# Patient Record
Sex: Male | Born: 1975 | Hispanic: Yes | Marital: Married | Smoking: Never smoker
Health system: Southern US, Community
[De-identification: ages and names within clinical notes are randomized; demographics above are authoritative.]

## PROBLEM LIST (undated history)

## (undated) DIAGNOSIS — G4730 Sleep apnea, unspecified: Secondary | ICD-10-CM

## (undated) DIAGNOSIS — R1030 Lower abdominal pain, unspecified: Secondary | ICD-10-CM

## (undated) DIAGNOSIS — I4891 Unspecified atrial fibrillation: Secondary | ICD-10-CM

## (undated) DIAGNOSIS — R609 Edema, unspecified: Secondary | ICD-10-CM

## (undated) DIAGNOSIS — R079 Chest pain, unspecified: Secondary | ICD-10-CM

## (undated) DIAGNOSIS — I10 Essential (primary) hypertension: Secondary | ICD-10-CM

## (undated) HISTORY — DX: Lower abdominal pain, unspecified: R10.30

## (undated) HISTORY — PX: WRIST SURGERY: SHX841

## (undated) HISTORY — DX: Unspecified atrial fibrillation: I48.91

## (undated) HISTORY — DX: Chest pain, unspecified: R07.9

## (undated) HISTORY — PX: PILONIDAL CYST DRAINAGE: SHX743

---

## 2018-10-02 ENCOUNTER — Emergency Department (HOSPITAL_BASED_OUTPATIENT_CLINIC_OR_DEPARTMENT_OTHER): Payer: Self-pay

## 2018-10-02 ENCOUNTER — Encounter (HOSPITAL_COMMUNITY): Payer: Self-pay | Admitting: Emergency Medicine

## 2018-10-02 ENCOUNTER — Emergency Department (HOSPITAL_COMMUNITY)
Admission: EM | Admit: 2018-10-02 | Discharge: 2018-10-02 | Disposition: A | Discharge status: Home / Self Care | Payer: Self-pay | Attending: Emergency Medicine | Admitting: Emergency Medicine

## 2018-10-02 ENCOUNTER — Other Ambulatory Visit: Payer: Self-pay

## 2018-10-02 DIAGNOSIS — R600 Localized edema: Secondary | ICD-10-CM

## 2018-10-02 DIAGNOSIS — M7989 Other specified soft tissue disorders: Secondary | ICD-10-CM

## 2018-10-02 DIAGNOSIS — R2242 Localized swelling, mass and lump, left lower limb: Secondary | ICD-10-CM | POA: Insufficient documentation

## 2018-10-02 LAB — BASIC METABOLIC PANEL
Anion gap: 9 (ref 5–15)
BUN: 14 mg/dL (ref 6–20)
CO2: 26 mmol/L (ref 22–32)
Calcium: 9.1 mg/dL (ref 8.9–10.3)
Chloride: 105 mmol/L (ref 98–111)
Creatinine, Ser: 1.17 mg/dL (ref 0.61–1.24)
GFR calc Af Amer: >60 mL/min (ref >60)
GFR calc non Af Amer: >60 mL/min (ref >60)
Glucose, Bld: 97 mg/dL (ref 70–99)
Potassium: 4 mmol/L (ref 3.5–5.1)
Sodium: 140 mmol/L (ref 135–145)

## 2018-10-02 LAB — CBC WITH DIFFERENTIAL/PLATELET
Abs Immature Granulocytes: 0.04 10*3/uL (ref 0.00–0.07)
BASOS PCT: 0 %
Basophils Absolute: 0 10*3/uL (ref 0.0–0.1)
Eosinophils Absolute: 0.1 10*3/uL (ref 0.0–0.5)
Eosinophils Relative: 1 %
HCT: 51.9 % (ref 39.0–52.0)
Hemoglobin: 15.8 g/dL (ref 13.0–17.0)
Immature Granulocytes: 0 %
Lymphocytes Relative: 19 %
Lymphs Abs: 1.8 10*3/uL (ref 0.7–4.0)
MCH: 26.2 pg (ref 26.0–34.0)
MCHC: 30.4 g/dL (ref 30.0–36.0)
MCV: 86.2 fL (ref 80.0–100.0)
Monocytes Absolute: 0.9 10*3/uL (ref 0.1–1.0)
Monocytes Relative: 9 %
Neutro Abs: 6.6 10*3/uL (ref 1.7–7.7)
Neutrophils Relative %: 71 %
Platelets: 259 10*3/uL (ref 150–400)
RBC: 6.02 MIL/uL — ABNORMAL HIGH (ref 4.22–5.81)
RDW: 13.5 % (ref 11.5–15.5)
WBC: 9.4 10*3/uL (ref 4.0–10.5)
nRBC: 0 % (ref 0.0–0.2)

## 2018-10-02 NOTE — ED Notes (Signed)
Patient verbalizes understanding of discharge instructions. Opportunity for questioning and answers were provided. Armband removed by staff, pt discharged from ED.  

## 2018-10-02 NOTE — Discharge Instructions (Addendum)
Return for any problem.  Follow-up with your regular care provider as instructed. °

## 2018-10-02 NOTE — ED Triage Notes (Signed)
Pt arrives to ED from with complaints of bi lateral feet swelling that started three days ago. Pt reports he was sent here from [facility] for DVT rule out.

## 2018-10-02 NOTE — ED Notes (Signed)
Leg swelling, no sob

## 2018-10-02 NOTE — ED Provider Notes (Addendum)
[facility] Provider Note   CSN: [id number] Arrival date & time: 10/02/18  1146     History   Chief Complaint Chief Complaint  Patient presents with  . Leg Swelling    HPI Bruce Marshall is a 43 y.o. male.  43 year old male with prior medical history as detailed below presents for evaluation of lower extremity edema.  He reports gradually worsening lower extremity edema to both legs over the last 2 to 3 weeks.  Patient was seen by urgent care provider earlier today.  He was sent to the ED to rule out DVT.  He denies prior history of DVT.  He denies any specific injury.  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  He does not have a particularly active job.  The history is provided by the patient and medical records.  Illness  Location:  Bilateral lower extremity edema Severity:  Mild Onset quality:  Gradual Duration:  3 weeks Timing:  Constant Progression:  Waxing and waning Chronicity:  New Associated symptoms: no chest pain and no fever     History reviewed. No pertinent past medical history.  There are no active problems to display for this patient.   History reviewed. No pertinent surgical history.      Home Medications    Prior to Admission medications   Not on File    Family History History reviewed. No pertinent family history.  Social History Social History   Tobacco Use  . Smoking status: Never Smoker  . Smokeless tobacco: Never Used  Substance Use Topics  . Alcohol use: Not on file  . Drug use: Not on file     Allergies   Patient has no allergy information on record.   Review of Systems Review of Systems  Constitutional: Negative for fever.  Cardiovascular: Negative for chest pain.  All other systems reviewed and are negative.    Physical Exam Updated Vital Signs BP (!) 147/94 (BP Location: Right Arm)   Pulse 97   Temp 98.2 F (36.8 C) (Oral)   Resp 16   Ht 5\' 6"  (1.676 m)   Wt  (!) 137.4 kg   SpO2 97%   BMI 48.91 kg/m   Physical Exam Vitals signs and nursing note reviewed.  Constitutional:      General: He is not in acute distress.    Appearance: He is well-developed.  HENT:     Head: Normocephalic and atraumatic.  Eyes:     Conjunctiva/sclera: Conjunctivae normal.     Pupils: Pupils are equal, round, and reactive to light.  Neck:     Musculoskeletal: Normal range of motion and neck supple.  Cardiovascular:     Rate and Rhythm: Normal rate and regular rhythm.     Heart sounds: Normal heart sounds.  Pulmonary:     Effort: Pulmonary effort is normal. No respiratory distress.     Breath sounds: Normal breath sounds.  Abdominal:     General: There is no distension.     Palpations: Abdomen is soft.     Tenderness: There is no abdominal tenderness.  Musculoskeletal: Normal range of motion.        General: No deformity.     Right lower leg: Edema present.     Left lower leg: Edema present.     Comments: Mild bilateral 1+ plus edema to BLE  Skin:    General: Skin is warm and dry.  Neurological:     Mental Status: He is alert and  oriented to person, place, and time.      ED Treatments / Results  Labs (all labs ordered are listed, but only abnormal results are displayed) Labs Reviewed  CBC WITH DIFFERENTIAL/PLATELET - Abnormal; Notable for the following components:      Result Value   RBC 6.02 (*)    All other components within normal limits  BASIC METABOLIC PANEL    EKG None  Radiology Vas [place] Lower Extremity Venous (dvt) (only Mc & Wl 7a-7p)  Result Date: 10/02/2018  Lower Venous Study Indications: Swelling.  Performing Technologist: Gertie Fey MHA, RDMS, RVT, RDCS  Examination Guidelines: A complete evaluation includes B-mode imaging, spectral Doppler, color Doppler, and power Doppler as needed of all accessible portions of each vessel. Bilateral testing is considered an integral part of a complete examination. Limited examinations  for reoccurring indications may be performed as noted.  Right Venous Findings: +---------+---------------+---------+-----------+----------+-------+          CompressibilityPhasicitySpontaneityPropertiesSummary +---------+---------------+---------+-----------+----------+-------+ CFV      Full           Yes      Yes                          +---------+---------------+---------+-----------+----------+-------+ SFJ      Full                                                 +---------+---------------+---------+-----------+----------+-------+ FV Prox  Full                                                 +---------+---------------+---------+-----------+----------+-------+ FV Mid   Full                                                 +---------+---------------+---------+-----------+----------+-------+ FV DistalFull                                                 +---------+---------------+---------+-----------+----------+-------+ PFV      Full                                                 +---------+---------------+---------+-----------+----------+-------+ POP      Full           Yes      Yes                          +---------+---------------+---------+-----------+----------+-------+ PTV      Full                                                 +---------+---------------+---------+-----------+----------+-------+ PERO     Full                                                 +---------+---------------+---------+-----------+----------+-------+  Left Venous Findings: +---------+---------------+---------+-----------+----------+-------+          CompressibilityPhasicitySpontaneityPropertiesSummary +---------+---------------+---------+-----------+----------+-------+ CFV      Full           Yes      Yes                          +---------+---------------+---------+-----------+----------+-------+ SFJ      Full                                                  +---------+---------------+---------+-----------+----------+-------+ FV Prox  Full                                                 +---------+---------------+---------+-----------+----------+-------+ FV Mid   Full                                                 +---------+---------------+---------+-----------+----------+-------+ FV DistalFull                                                 +---------+---------------+---------+-----------+----------+-------+ PFV      Full                                                 +---------+---------------+---------+-----------+----------+-------+ POP      Full           Yes      Yes                          +---------+---------------+---------+-----------+----------+-------+ PTV      Full                                                 +---------+---------------+---------+-----------+----------+-------+ PERO     Full                                                 +---------+---------------+---------+-----------+----------+-------+    Summary: Right: There is no evidence of deep vein thrombosis in the lower extremity. No cystic structure found in the popliteal fossa. Left: There is no evidence of deep vein thrombosis in the lower extremity. No cystic structure found in the popliteal fossa.  *See table(s) above for measurements and observations.    Preliminary     Procedures Procedures (including critical care time)  Medications Ordered in ED Medications - No data to display   Initial Impression / Assessment and Plan / ED Course  I have reviewed the triage vital signs and the nursing notes.  Pertinent labs & imaging results that were available during my care of the patient were reviewed by me and considered in my medical decision making (see chart for details).     MDM  Screen complete  43 year old male presenting for evaluation of lower extremity edema.  This is most likely secondary to  patient's body habitus and low level of activity.  Screening labs obtained in the ED are without significant abnormality.  DVT study is negative.    Patient does understand the need for close outpatient follow-up.  Strict return precautions given and understood.  Final Clinical Impressions(s) / ED Diagnoses   Final diagnoses:  Leg edema    ED Discharge Orders    None       Wynetta FinesMessick, Peter C, MD 10/02/18 1440    Wynetta FinesMessick, Peter C, MD 11/12/18 [phone]

## 2018-10-02 NOTE — Progress Notes (Signed)
Bilateral lower extremity venous duplex completed. Refer to "CV Proc" under chart review to view preliminary results.  10/02/2018 1:19 PM Gertie Fey, MHA, RVT, RDCS, RDMS

## 2018-10-26 ENCOUNTER — Ambulatory Visit (INDEPENDENT_AMBULATORY_CARE_PROVIDER_SITE_OTHER): Payer: Self-pay | Admitting: Family Medicine

## 2018-10-26 ENCOUNTER — Ambulatory Visit: Payer: Self-pay

## 2018-10-26 ENCOUNTER — Encounter: Payer: Self-pay | Admitting: Family Medicine

## 2018-10-26 VITALS — BP 132/83 | HR 80 | Resp 17 | Ht 67.0 in | Wt 300.6 lb

## 2018-10-26 DIAGNOSIS — Z1329 Encounter for screening for other suspected endocrine disorder: Secondary | ICD-10-CM

## 2018-10-26 DIAGNOSIS — Z7689 Persons encountering health services in other specified circumstances: Secondary | ICD-10-CM

## 2018-10-26 DIAGNOSIS — R600 Localized edema: Secondary | ICD-10-CM

## 2018-10-26 DIAGNOSIS — R2242 Localized swelling, mass and lump, left lower limb: Secondary | ICD-10-CM

## 2018-10-26 DIAGNOSIS — Z131 Encounter for screening for diabetes mellitus: Secondary | ICD-10-CM

## 2018-10-26 DIAGNOSIS — R2241 Localized swelling, mass and lump, right lower limb: Secondary | ICD-10-CM

## 2018-10-26 MED ORDER — FUROSEMIDE 20 MG PO TABS: 20.0000 mg | ORAL_TABLET | Freq: Every day | ORAL | 3 refills | Status: DC | PRN

## 2018-10-26 MED ORDER — POTASSIUM CHLORIDE ER 10 MEQ PO TBCR: 10.0000 meq | EXTENDED_RELEASE_TABLET | Freq: Every day | ORAL | 0 refills | Status: DC | PRN

## 2018-10-26 NOTE — Progress Notes (Deleted)
New Patient Office Visit  Subjective:  Patient ID: Bruce Marshall, male    DOB: 1975-11-16  Age: 43 y.o. MRN: [id number]  CC:  Chief Complaint  Patient presents with  . Establish Care  . Follow-up    ED 1/17: B lower extremity swelling. still having the L leg swelling. has been wearing compression socks. is also getting out of the car more frequently    HPI Bruce Marshall is a 43 year old Hispanic male that presents today for Emergency Department follow up on bilateral lower extremity swelling. He endorses that he has been wearing the compression socks, however he hasn't decreased fluid intake as advised post ED visit. He endorses that he wasn't experiencing any symptoms and denies any injury leading up to the swelling in his lower extremities. He denies any injury or pain in the area. He endorses that he doesn't exercise and has a sedentary lifestyle.He endorses that he is overall generally healthy. He denies any chest pain, palpitations or shortness of breath.  No past medical history on file.  No past surgical history on file.  Family History  Problem Relation Age of Onset  . Healthy Daughter   . Heart attack Maternal Uncle   . Cancer Neg Hx   . Stroke Neg Hx   Hypertension- Mother  Social History   Socioeconomic History  . Marital status: Married    Spouse name: Not on file  . Number of children: Not on file  . Years of education: Not on file  . Highest education level: Not on file  Occupational History  . Not on file  Social Needs  . Financial resource strain: Not on file  . Food insecurity:    Worry: Not on file    Inability: Not on file  . Transportation needs:    Medical: Not on file    Non-medical: Not on file  Tobacco Use  . Smoking status: Never Smoker  . Smokeless tobacco: Never Used  Substance and Sexual Activity  . Alcohol use: Not on file  . Drug use: Not on file  . Sexual activity: Not on file  Lifestyle  . Physical activity:    Days per week:  Not on file    Minutes per session: Not on file  . Stress: Not on file  Relationships  . Social connections:    Talks on phone: Not on file    Gets together: Not on file    Attends religious service: Not on file    Active member of club or organization: Not on file    Attends meetings of clubs or organizations: Not on file    Relationship status: Not on file  . Intimate partner violence:    Fear of current or ex partner: Not on file    Emotionally abused: Not on file    Physically abused: Not on file    Forced sexual activity: Not on file  Other Topics Concern  . Not on file  Social History Narrative  . Not on file    ROS Review of Systems  Constitutional: Negative for activity change, appetite change, fatigue, fever and unexpected weight change.  Respiratory: Negative for cough, chest tightness, shortness of breath and wheezing.   Cardiovascular: Positive for leg swelling. Negative for chest pain and palpitations.  Endocrine: Positive for polydipsia and polyuria.  Neurological: Negative for dizziness, light-headedness, numbness and headaches.    Objective:   Today's Vitals: BP 132/83   Pulse 80   Resp 17  Ht 5\' 7"  (1.702 m)   Wt (!) 300 lb 9.6 oz (136.4 kg)   SpO2 97%   BMI 47.08 kg/m   Physical Exam Constitutional:      Appearance: Normal appearance. He is normal weight.  Cardiovascular:     Rate and Rhythm: Normal rate and regular rhythm.     Pulses: Normal pulses.     Heart sounds: Normal heart sounds.  Pulmonary:     Effort: Pulmonary effort is normal.     Breath sounds: Normal breath sounds.  Musculoskeletal:     Right lower leg: Edema present.     Left lower leg: Edema present.     Comments: 1+ pitting edema in the left pedal and lower left leg and some trace edema in the right foot and lower leg  Skin:    General: Skin is warm and dry.     Findings: Erythema present.     Comments: Redness in lower bilateral legs  Neurological:     Mental Status:  He is alert and oriented to person, place, and time. Mental status is at baseline.  Psychiatric:        Mood and Affect: Mood normal.        Behavior: Behavior normal.        Thought Content: Thought content normal.        Judgment: Judgment normal.     Assessment & Plan:   Problem List Items Addressed This Visit    None    Visit Diagnoses    Encounter to establish care    -  Primary   Bilateral edema of lower extremity       Relevant Orders   EKG 12-Lead (Completed)   DG Chest 2 View (Completed)   Sedimentation Rate   Screening for thyroid disorder       Relevant Orders   Thyroid Panel With TSH   Screening for diabetes mellitus       Relevant Orders   Hemoglobin A1c      Outpatient Encounter Medications as of 10/26/2018  Medication Sig  . furosemide (LASIX) 20 MG tablet Take 1 tablet (20 mg total) by mouth daily as needed for fluid or edema (Take one dose of potassium with Furosemide dose).  . potassium chloride (K-DUR) 10 MEQ tablet Take 1 tablet (10 mEq total) by mouth daily as needed (Take one dose with each dose of Lasix as need).   No facility-administered encounter medications on file as of 10/26/2018.     Lasix and potassium as needed for lower extremity swelling, and continued use of compression socks.  A total of 20 minutes spent, greater than 50 % of this time was spent assessing, counseling and coordination of care.  Follow-up: Return in about 4 weeks (around 11/23/2018) for return for swelling follow up and lasix use.    Legrand Pitts, FNP-S

## 2018-10-26 NOTE — Progress Notes (Deleted)
Bruce Marshall, is a 43 y.o. male  POL:[id number]  HOO:[id number]  DOB - 1976/02/25  CC:  Chief Complaint  Patient presents with  . Establish Care  . Follow-up    ED 1/17: B lower extremity swelling. still having the L leg swelling. has been wearing compression socks. is also getting out of the car more frequently       HPI: Bruce Marshall is a 43 y.o. male is here today to establish care.   Bruce Marshall does not have a problem list on file.    Today's visit:    Patient denies new headaches, chest pain, abdominal pain, nausea, new weakness , numbness or tingling, SOB, edema, or worrisome cough. .    Current medications:No current outpatient medications on file.   Pertinent family medical history: family history includes Healthy in his daughter; Heart attack in his maternal uncle.   No Known Allergies  Social History   Socioeconomic History  . Marital status: Married    Spouse name: Not on file  . Number of children: Not on file  . Years of education: Not on file  . Highest education level: Not on file  Occupational History  . Not on file  Social Needs  . Financial resource strain: Not on file  . Food insecurity:    Worry: Not on file    Inability: Not on file  . Transportation needs:    Medical: Not on file    Non-medical: Not on file  Tobacco Use  . Smoking status: Never Smoker  . Smokeless tobacco: Never Used  Substance and Sexual Activity  . Alcohol use: Not on file  . Drug use: Not on file  . Sexual activity: Not on file  Lifestyle  . Physical activity:    Days per week: Not on file    Minutes per session: Not on file  . Stress: Not on file  Relationships  . Social connections:    Talks on phone: Not on file    Gets together: Not on file    Attends religious service: Not on file    Active member of club or organization: Not on file    Attends meetings of clubs or organizations: Not on file    Relationship status: Not on file  . Intimate partner  violence:    Fear of current or ex partner: Not on file    Emotionally abused: Not on file    Physically abused: Not on file    Forced sexual activity: Not on file  Other Topics Concern  . Not on file  Social History Narrative  . Not on file    Review of Systems: Constitutional: Negative for fever, chills, diaphoresis, activity change, appetite change and fatigue. HENT: Negative for ear pain, nosebleeds, congestion, facial swelling, rhinorrhea, neck pain, neck stiffness and ear discharge.  Eyes: Negative for pain, discharge, redness, itching and visual disturbance. Respiratory: Negative for cough, choking, chest tightness, shortness of breath, wheezing and stridor.  Cardiovascular: Negative for chest pain, palpitations and leg swelling. Gastrointestinal: Negative for abdominal distention. Genitourinary: Negative for dysuria, urgency, frequency, hematuria, flank pain, decreased urine volume, difficulty urinating. Musculoskeletal: Negative for back pain, joint swelling, arthralgia and gait problem. Neurological: Negative for dizziness, tremors, seizures, syncope, facial asymmetry, speech difficulty, weakness, light-headedness, numbness and headaches.  Hematological: Negative for adenopathy. Does not bruise/bleed easily. Psychiatric/Behavioral: Negative for hallucinations, behavioral problems, confusion, dysphoric mood, decreased concentration and agitation.    Objective:   Vitals:   10/26/18 1047  BP: 132/83  Pulse: 80  Resp: 17  SpO2: 97%    BP Readings from Last 3 Encounters:  10/26/18 132/83  10/02/18 (!) 147/94    Filed Weights   10/26/18 1047  Weight: (!) 300 lb 9.6 oz (136.4 kg)      Physical Exam: Constitutional: Patient appears well-developed and well-nourished. No distress. HENT: Normocephalic, atraumatic, External right and left ear normal. Oropharynx is clear and moist.  Eyes: Conjunctivae and EOM are normal. PERRLA, no scleral icterus. Neck: Normal ROM. Neck  supple. No JVD. No tracheal deviation. No thyromegaly. CVS: RRR, S1/S2 +, no murmurs, no gallops, no carotid bruit.  Pulmonary: Effort and breath sounds normal, no stridor, rhonchi, wheezes, rales.  Abdominal: Soft. BS +, no distension, tenderness, rebound or guarding.  Musculoskeletal: Normal range of motion. No edema and no tenderness.  Neuro: Alert. Normal muscle tone coordination. Normal gait. BUE and BLE strength 5/5. Bilateral hand grips symmetrical. No cranial nerve deficit. Skin: Skin is warm and dry. No rash noted. Not diaphoretic. No erythema. No pallor. Psychiatric: Normal mood and affect. Behavior, judgment, thought content normal.  Lab Results (prior encounters)  Lab Results  Component Value Date   WBC 9.4 10/02/2018   HGB 15.8 10/02/2018   HCT 51.9 10/02/2018   MCV 86.2 10/02/2018   PLT 259 10/02/2018   Lab Results  Component Value Date   CREATININE 1.17 10/02/2018   BUN 14 10/02/2018   NA 140 10/02/2018   K 4.0 10/02/2018   CL 105 10/02/2018   CO2 26 10/02/2018    No results found for: HGBA1C  No results found for: CHOL, TRIG, HDL, CHOLHDL, VLDL, LDLCALC      Assessment and plan:  1. Encounter to establish care ***   No follow-ups on file.   The patient was given clear instructions to go to ER or return to medical center if symptoms don't improve, worsen or new problems develop. The patient verbalized understanding. The patient was advised  to call and obtain lab results if they haven't heard anything from out office within 7-10 business days.  Bruce CourtsKimberly Maryjayne Kleven, FNP [facility] [address], [place] [id number] [phone]: [phone]    This note has been created with Dragon speech recognition software and Paediatric nursesmart phrase technology. Any transcriptional errors are unintentional.

## 2018-10-26 NOTE — Patient Instructions (Addendum)
Thank you for choosing Primary Care at [facility] to be your medical home!    Bruce Marshall was seen by Joaquin Courts, FNP today.   Marquita Palms Laden's primary care provider is Bing Neighbors, FNP.   For the best care possible, you should try to see Joaquin Courts, FNP-C whenever you come to the clinic.   We look forward to seeing you again soon!  If you have any questions about your visit today, please call us at [phone] or feel free to reach your primary care provider via MyChart.    I am prescribing lasix and potassium as needed. Please only take when you have swelling. You must take the lasix and potassium together.  I will have you follow up in 4 weeks for return for monitoring of lower extremity swelling and lasix use  Please continue to wear your compression socks daily Edema  Edema is when you have too much fluid in your body or under your skin. Edema may make your legs, feet, and ankles swell up. Swelling is also common in looser tissues, like around your eyes. This is a common condition. It gets more common as you get older. There are many possible causes of edema. Eating too much salt (sodium) and being on your feet or sitting for a long time can cause edema in your legs, feet, and ankles. Hot weather may make edema worse. Edema is usually painless. Your skin may look swollen or shiny. Follow these instructions at home:  Keep the swollen body part raised (elevated) above the level of your heart when you are sitting or lying down.  Do not sit still or stand for a long time.  Do not wear tight clothes. Do not wear garters on your upper legs.  Exercise your legs. This can help the swelling go down.  Wear elastic bandages or support stockings as told by your doctor.  Eat a low-salt (low-sodium) diet to reduce fluid as told by your doctor.  Depending on the cause of your swelling, you may need to limit how much fluid you drink (fluid restriction).  Take  over-the-counter and prescription medicines only as told by your doctor. Contact a doctor if:  Treatment is not working.  You have heart, liver, or kidney disease and have symptoms of edema.  You have sudden and unexplained weight gain. Get help right away if:  You have shortness of breath or chest pain.  You cannot breathe when you lie down.  You have pain, redness, or warmth in the swollen areas.  You have heart, liver, or kidney disease and get edema all of a sudden.  You have a fever and your symptoms get worse all of a sudden. Summary  Edema is when you have too much fluid in your body or under your skin.  Edema may make your legs, feet, and ankles swell up. Swelling is also common in looser tissues, like around your eyes.  Raise (elevate) the swollen body part above the level of your heart when you are sitting or lying down.  Follow your doctor's instructions about diet and how much fluid you can drink (fluid restriction). This information is not intended to replace advice given to you by your health care provider. Make sure you discuss any questions you have with your health care provider. Document Released: 02/19/2008 Document Revised: 09/20/2016 Document Reviewed: 09/20/2016 Elsevier Interactive Patient Education  2019 Elsevier Inc.  Preventing Hypertension Hypertension, commonly called high blood pressure, is when the force of blood  pumping through the arteries is too strong. Arteries are blood vessels that carry blood from the heart throughout the body. Over time, hypertension can damage the arteries and decrease blood flow to important parts of the body, including the brain, heart, and kidneys. Often, hypertension does not cause symptoms until blood pressure is very high. For this reason, it is important to have your blood pressure checked on a regular basis. Hypertension can often be prevented with diet and lifestyle changes. If you already have hypertension, you can  control it with diet and lifestyle changes, as well as medicine. What nutrition changes can be made? Maintain a healthy diet. This includes:  Eating less salt (sodium). Ask your health care provider how much sodium is safe for you to have. The general recommendation is to consume less than 1 tsp (2,300 mg) of sodium a day. ? Do not add salt to your food. ? Choose low-sodium options when grocery shopping and eating out.  Limiting fats in your diet. You can do this by eating low-fat or fat-free dairy products and by eating less red meat.  Eating more fruits, vegetables, and whole grains. Make a goal to eat: ? 1-2 cups of fresh fruits and vegetables each day. ? 3-4 servings of whole grains each day.  Avoiding foods and beverages that have added sugars.  Eating fish that contain healthy fats (omega-3 fatty acids), such as mackerel or salmon. If you need help putting together a healthy eating plan, try the DASH diet. This diet is high in fruits, vegetables, and whole grains. It is low in sodium, red meat, and added sugars. DASH stands for Dietary Approaches to Stop Hypertension. What lifestyle changes can be made?   Lose weight if you are overweight. Losing just 3?5% of your body weight can help prevent or control hypertension. ? For example, if your present weight is 200 lb (91 kg), a loss of 3-5% of your weight means losing 6-10 lb (2.7-4.5 kg). ? Ask your health care provider to help you with a diet and exercise plan to safely lose weight.  Get enough exercise. Do at least 150 minutes of moderate-intensity exercise each week. ? You could do this in short exercise sessions several times a day, or you could do longer exercise sessions a few times a week. For example, you could take a brisk 10-minute walk or bike ride, 3 times a day, for 5 days a week.  Find ways to reduce stress, such as exercising, meditating, listening to music, or taking a yoga class. If you need help reducing stress, ask  your health care provider.  Do not smoke. This includes e-cigarettes. Chemicals in tobacco and nicotine products raise your blood pressure each time you smoke. If you need help quitting, ask your health care provider.  Avoid alcohol. If you drink alcohol, limit alcohol intake to no more than 1 drink a day for nonpregnant women and 2 drinks a day for men. One drink equals 12 oz of beer, 5 oz of wine, or 1 oz of hard liquor. Why are these changes important? Diet and lifestyle changes can help you prevent hypertension, and they may make you feel better overall and improve your quality of life. If you have hypertension, making these changes will help you control it and help prevent major complications, such as:  Hardening and narrowing of arteries that supply blood to: ? Your heart. This can cause a heart attack. ? Your brain. This can cause a stroke. ? Your kidneys. This  can cause kidney failure.  Stress on your heart muscle, which can cause heart failure. What can I do to lower my risk?  Work with your health care provider to make a hypertension prevention plan that works for you. Follow your plan and keep all follow-up visits as told by your health care provider.  Learn how to check your blood pressure at home. Make sure that you know your personal target blood pressure, as told by your health care provider. How is this treated? In addition to diet and lifestyle changes, your health care provider may recommend medicines to help lower your blood pressure. You may need to try a few different medicines to find what works best for you. You also may need to take more than one medicine. Take over-the-counter and prescription medicines only as told by your health care provider. Where to find support Your health care provider can help you prevent hypertension and help you keep your blood pressure at a healthy level. Your local hospital or your community may also provide support services and prevention  programs. The American Heart Association offers an online support network at: https://www.lee.net/http://supportnetwork.heart.org/high-blood-pressure Where to find more information Learn more about hypertension from:  National Heart, Lung, and Blood Institute: https://www.peterson.org/www.nhlbi.nih.gov/health/health-topics/topics/hbp  Centers for Disease Control and Prevention: AboutHD.co.nzwww.cdc.gov/bloodpressure  American Academy of Family Physicians: http://familydoctor.org/familydoctor/en/diseases-conditions/high-blood-pressure.printerview.all.html Learn more about the DASH diet from:  National Heart, Lung, and Blood Institute: WedMap.itwww.nhlbi.nih.gov/health/health-topics/topics/dash Contact a health care provider if:  You think you are having a reaction to medicines you have taken.  You have recurrent headaches or feel dizzy.  You have swelling in your ankles.  You have trouble with your vision. Summary  Hypertension often does not cause any symptoms until blood pressure is very high. It is important to get your blood pressure checked regularly.  Diet and lifestyle changes are the most important steps in preventing hypertension.  By keeping your blood pressure in a healthy range, you can prevent complications like heart attack, heart failure, stroke, and kidney failure.  Work with your health care provider to make a hypertension prevention plan that works for you. This information is not intended to replace advice given to you by your health care provider. Make sure you discuss any questions you have with your health care provider. Document Released: 09/17/2015 Document Revised: 05/13/2016 Document Reviewed: 05/13/2016 Elsevier Interactive Patient Education  2019 [employer]

## 2018-10-27 ENCOUNTER — Telehealth: Payer: Self-pay | Admitting: Family Medicine

## 2018-10-27 LAB — HEMOGLOBIN A1C
Est. average glucose Bld gHb Est-mCnc: 126 mg/dL
Hgb A1c MFr Bld: 6 % — ABNORMAL HIGH (ref 4.8–5.6)

## 2018-10-27 LAB — THYROID PANEL WITH TSH
Free Thyroxine Index: 1.7 (ref 1.2–4.9)
T3 Uptake Ratio: 26 % (ref 24–39)
T4, Total: 6.5 ug/dL (ref 4.5–12.0)
TSH: 2.52 u[IU]/mL (ref 0.450–4.500)

## 2018-10-27 LAB — SEDIMENTATION RATE: SED RATE: 39 mm/h — AB (ref 0–15)

## 2018-10-27 NOTE — Telephone Encounter (Signed)
Notify  patient of normal chest x-ray. No changes to plan discussed during office visit yesterday.

## 2018-10-27 NOTE — Telephone Encounter (Signed)
Pt returned call and informed of chest x ray results.

## 2018-11-01 NOTE — Progress Notes (Signed)
Bruce Marshall, is a 43 y.o. male  DJS:[id number]  YIF:[id number]  DOB - 02/02/1976  CC:  Chief Complaint  Patient presents with  . Establish Care  . Follow-up    ED 1/17: B lower extremity swelling. still having the L leg swelling. has been wearing compression socks. is also getting out of the car more frequently       HPI: Bruce Marshall is a 43 y.o. male is here today to establish care.   Bruce Marshall does not have a problem list on file.    Today's visit:  Ana Klebe is here for ED follow-up. He presented to the ER 10/02/18 with a complaint of bilateral lower leg swelling. Work-up was negative for DVT. Swelling had been present at that time for 2- weeks. ER discharged provider recommended compression wear and ambulation which has improved symptoms. He continues to have mild residual left leg swelling. Reports compliance with compression wear. He sits > 10 hours per day with work. No chest pain, shortness of breath, history of cardiovascular disease, or prior lower extremity injury. No recent increase consumption of sodium. Current Body mass index is 47.08 kg/m.   Current medications: Current Outpatient Medications:  .  furosemide (LASIX) 20 MG tablet, Take 1 tablet (20 mg total) by mouth daily as needed for fluid or edema (Take one dose of potassium with Furosemide dose)., Disp: 30 tablet, Rfl: 3 .  potassium chloride (K-DUR) 10 MEQ tablet, Take 1 tablet (10 mEq total) by mouth daily as needed (Take one dose with each dose of Lasix as need)., Disp: 30 tablet, Rfl: 0   Pertinent family medical history: family history includes Healthy in his daughter; Heart attack in his maternal uncle.   No Known Allergies  Social History   Socioeconomic History  . Marital status: Married    Spouse name: Not on file  . Number of children: Not on file  . Years of education: Not on file  . Highest education level: Not on file  Occupational History  . Not on file  Social Needs  . Financial  resource strain: Not on file  . Food insecurity:    Worry: Not on file    Inability: Not on file  . Transportation needs:    Medical: Not on file    Non-medical: Not on file  Tobacco Use  . Smoking status: Never Smoker  . Smokeless tobacco: Never Used  Substance and Sexual Activity  . Alcohol use: Not on file  . Drug use: Not on file  . Sexual activity: Not on file  Lifestyle  . Physical activity:    Days per week: Not on file    Minutes per session: Not on file  . Stress: Not on file  Relationships  . Social connections:    Talks on phone: Not on file    Gets together: Not on file    Attends religious service: Not on file    Active member of club or organization: Not on file    Attends meetings of clubs or organizations: Not on file    Relationship status: Not on file  . Intimate partner violence:    Fear of current or ex partner: Not on file    Emotionally abused: Not on file    Physically abused: Not on file    Forced sexual activity: Not on file  Other Topics Concern  . Not on file  Social History Narrative  . Not on file    Review of Systems: Pertinent negatives  listed in HPI  Objective:   Vitals:   10/26/18 1047  BP: 132/83  Pulse: 80  Resp: 17  SpO2: 97%    BP Readings from Last 3 Encounters:  10/26/18 132/83  10/02/18 (!) 147/94    Filed Weights   10/26/18 1047  Weight: (!) 300 lb 9.6 oz (136.4 kg)      Physical Exam: Constitutional: Patient appears well-developed and well-nourished. No distress. HENT: Normocephalic, atraumatic, External right and left ear normal. Eyes: Conjunctivae and EOM are normal. PERRLA, no scleral icterus. Neck: Normal ROM. Neck supple. No JVD. No tracheal deviation. No thyromegaly. CVS: RRR, S1/S2 +, no murmurs, no gallops, no carotid bruit.  Pulmonary: Effort and breath sounds normal, no stridor, rhonchi, wheezes, rales.  Abdominal: Soft. BS +, no distension, tenderness, rebound or guarding.  Musculoskeletal:  Normal range of motion. Trace bilateral LE edema and no tenderness.  Neuro: Alert. Normal muscle tone coordination. Normal gait. BUE and BLE strength 5/5. Bilateral hand grips symmetrical. Skin: Skin is warm and dry. No rash noted. Not diaphoretic. No erythema. No pallor. Psychiatric: Normal mood and affect. Behavior, judgment, thought content normal.  Lab Results (prior encounters)  Lab Results  Component Value Date   WBC 9.4 10/02/2018   HGB 15.8 10/02/2018   HCT 51.9 10/02/2018   MCV 86.2 10/02/2018   PLT 259 10/02/2018   Lab Results  Component Value Date   CREATININE 1.17 10/02/2018   BUN 14 10/02/2018   NA 140 10/02/2018   K 4.0 10/02/2018   CL 105 10/02/2018   CO2 26 10/02/2018    Lab Results  Component Value Date   HGBA1C 6.0 (H) 10/26/2018    No results found for: CHOL, TRIG, HDL, CHOLHDL, VLDL, LDLCALC      Assessment and plan:  1. Encounter to establish care 2. Bilateral edema of lower extremity Will trial PRN furosemide 20 mg with potassium replacement  To reduce leg swelling.  Patient is to follow-up in 4-6 weeks for evaluation of leg swelling   - EKG 12-Lead, negative LVH or any ST changes. Rhythm CR - DG Chest 2 View; negative of any acute findings  - Sedimentation Rate  3. Screening for thyroid disorder - Thyroid Panel With TSH  4. Screening for diabetes mellitus - Hemoglobin A1c   Return in about 4 weeks (around 11/23/2018) for return for swelling follow up and lasix use.   The patient was given clear instructions to go to ER or return to medical center if symptoms don't improve, worsen or new problems develop. The patient verbalized understanding. The patient was advised  to call and obtain lab results if they haven't heard anything from out office within 7-10 business days.  Joaquin Courts, FNP-C Primary Care at [facility] [address] [place] [address] [phone]: [phone]    This note has been created with  Dragon speech recognition software and Paediatric nurse. Any transcriptional errors are unintentional.

## 2018-11-06 ENCOUNTER — Telehealth: Payer: Self-pay | Admitting: Family Medicine

## 2018-11-06 NOTE — Progress Notes (Signed)
Patient notified of results & recommendations. Expressed understanding.

## 2018-11-23 ENCOUNTER — Ambulatory Visit: Payer: Self-pay | Admitting: Family Medicine

## 2018-11-25 ENCOUNTER — Ambulatory Visit (INDEPENDENT_AMBULATORY_CARE_PROVIDER_SITE_OTHER): Payer: Self-pay | Admitting: Family Medicine

## 2018-11-25 ENCOUNTER — Other Ambulatory Visit: Payer: Self-pay

## 2018-11-25 ENCOUNTER — Encounter: Payer: Self-pay | Admitting: Family Medicine

## 2018-11-25 VITALS — BP 112/75 | HR 75 | Temp 98.4°F | Resp 17 | Ht 67.0 in | Wt 293.4 lb

## 2018-11-25 DIAGNOSIS — R7303 Prediabetes: Secondary | ICD-10-CM

## 2018-11-25 DIAGNOSIS — M7989 Other specified soft tissue disorders: Secondary | ICD-10-CM

## 2018-11-25 DIAGNOSIS — R238 Other skin changes: Secondary | ICD-10-CM

## 2018-11-25 DIAGNOSIS — R600 Localized edema: Secondary | ICD-10-CM

## 2018-11-25 DIAGNOSIS — Z6841 Body Mass Index (BMI) 40.0 and over, adult: Secondary | ICD-10-CM

## 2018-11-25 DIAGNOSIS — E669 Obesity, unspecified: Secondary | ICD-10-CM

## 2018-11-25 MED ORDER — PREDNISONE 20 MG PO TABS: ORAL_TABLET | ORAL | 0 refills | Status: DC

## 2018-11-25 NOTE — Patient Instructions (Signed)

## 2018-11-25 NOTE — Progress Notes (Signed)
Patient ID: Bruce Marshall, male    DOB: 1975/09/22, 43 y.o.   MRN: [id number]  PCP: Bing Neighbors, FNP  Chief Complaint  Patient presents with  . Leg Swelling    swelling has improved some. has only taken the Lasix 4-5 times since last visit. still wearing compression socks    Subjective:  HPI  Bruce Marshall is a 43 y.o. male presents for evaluation bilateral low extremity edema and lower leg erythema. Patient is a non-smoker.Patient was last seen in office on 10/26/18 for the same problem. Prior to his office visit, he was evaluated for acute onset leg swelling in the ER 10/02/18. He underwent a vascular study to rule out DVT. He has had a chest x-ray and ECG which were negative for cardiopulmonary indications for cause of leg swelling. He was placed on Lasix during his prior visit and reports only mild improvement of swelling. He complains of persistent lower leg redness that is more pronounced when leg swelling worsens. Lower legs are not itchy he denies any recent or prior insect bites. He is wearing compression socks now which helps reduce swelling during the day. Swelling reoccurs at night. Denies cough, shortness of breath, or chest tightness.  Social History   Socioeconomic History  . Marital status: Married    Spouse name: Not on file  . Number of children: Not on file  . Years of education: Not on file  . Highest education level: Not on file  Occupational History  . Not on file  Social Needs  . Financial resource strain: Not on file  . Food insecurity:    Worry: Not on file    Inability: Not on file  . Transportation needs:    Medical: Not on file    Non-medical: Not on file  Tobacco Use  . Smoking status: Never Smoker  . Smokeless tobacco: Never Used  Substance and Sexual Activity  . Alcohol use: Not on file  . Drug use: Not on file  . Sexual activity: Not on file  Lifestyle  . Physical activity:    Days per week: Not on file    Minutes per session: Not on  file  . Stress: Not on file  Relationships  . Social connections:    Talks on phone: Not on file    Gets together: Not on file    Attends religious service: Not on file    Active member of club or organization: Not on file    Attends meetings of clubs or organizations: Not on file    Relationship status: Not on file  . Intimate partner violence:    Fear of current or ex partner: Not on file    Emotionally abused: Not on file    Physically abused: Not on file    Forced sexual activity: Not on file  Other Topics Concern  . Not on file  Social History Narrative  . Not on file    Family History  Problem Relation Age of Onset  . Healthy Daughter   . Heart attack Maternal Uncle   . Cancer Neg Hx   . Stroke Neg Hx      Review of Systems  There are no active problems to display for this patient.   No Known Allergies  Prior to Admission medications   Medication Sig Start Date End Date Taking? Authorizing Provider  furosemide (LASIX) 20 MG tablet Take 1 tablet (20 mg total) by mouth daily as needed for fluid or edema (Take one  dose of potassium with Furosemide dose). 10/26/18  Yes Bing Neighbors, FNP  potassium chloride (K-DUR) 10 MEQ tablet Take 1 tablet (10 mEq total) by mouth daily as needed (Take one dose with each dose of Lasix as need). 10/26/18  Yes Bing Neighbors, FNP  predniSONE (DELTASONE) 20 MG tablet Take 3 PO QAM x3days, 2 PO QAM x3days, 1 PO QAM x3days 11/25/18   Bing Neighbors, FNP    Past Medical, Surgical Family and Social History reviewed and updated.    Objective:   Today's Vitals   11/25/18 1023  BP: 112/75  Pulse: 75  Resp: 17  Temp: 98.4 F (36.9 C)  TempSrc: Oral  SpO2: 96%  Weight: 293 lb 6.4 oz (133.1 kg)  Height: 5\' 7"  (1.702 m)    Wt Readings from Last 3 Encounters:  11/25/18 293 lb 6.4 oz (133.1 kg)  10/26/18 (!) 300 lb 9.6 oz (136.4 kg)  10/02/18 (!) 303 lb (137.4 kg)     Physical Exam General appearance: alert, well  developed, well nourished, cooperative and in no distress Head: Normocephalic, without obvious abnormality, atraumatic Respiratory: Respirations even and unlabored, normal respiratory rate Heart: rate and rhythm normal. No gallop or murmurs noted on exam  Extremities: bilateral lower leg edema with mild erythema present    Skin: Skin color, texture, turgor appropriate.  Psych: Appropriate mood and affect. Neurologic: Mental status: Alert, oriented to person, place, and time, thought content appropriate.  No results found for: POCGLU  Lab Results  Component Value Date   HGBA1C 6.0 (H) 10/26/2018    Assessment & Plan:  1. Lower extremity edema 2. Redness and swelling of lower leg Vasculitis? Continue lasix PRN  And Potassium replacement with each dose Trial prednisone. Return in 2-3 weeks. If no significant improvement, will refer patient to vascular.  3. Prediabetes A1C 6.0 Recommend lifestyle changes such as engaging in routine physical activity with a goal of 150 minutes per week, increasing intake of vegetables, fruits, fiber, and selecting lean cuts of meat.  Current BMI Body mass index is 45.95 kg/m.  Meds ordered this encounter  Medications  . predniSONE (DELTASONE) 20 MG tablet    Sig: Take 3 PO QAM x3days, 2 PO QAM x3days, 1 PO QAM x3days    Dispense:  18 tablet    Refill:  0     -The patient was given clear instructions to go to ER or return to medical center if symptoms do not improve, worsen or new problems develop. The patient verbalized understanding.    Bruce Courts, FNP Primary Care at [facility] [address], [place] [address] [phone]: [phone]

## 2018-11-29 DIAGNOSIS — R7303 Prediabetes: Secondary | ICD-10-CM | POA: Insufficient documentation

## 2018-11-29 DIAGNOSIS — M7989 Other specified soft tissue disorders: Secondary | ICD-10-CM | POA: Insufficient documentation

## 2018-11-29 DIAGNOSIS — R600 Localized edema: Secondary | ICD-10-CM | POA: Insufficient documentation

## 2018-11-29 DIAGNOSIS — R238 Other skin changes: Secondary | ICD-10-CM | POA: Insufficient documentation

## 2018-12-16 ENCOUNTER — Ambulatory Visit (INDEPENDENT_AMBULATORY_CARE_PROVIDER_SITE_OTHER): Payer: Self-pay | Admitting: Family Medicine

## 2018-12-16 ENCOUNTER — Encounter: Payer: Self-pay | Admitting: Family Medicine

## 2018-12-16 ENCOUNTER — Other Ambulatory Visit: Payer: Self-pay

## 2018-12-16 DIAGNOSIS — L538 Other specified erythematous conditions: Secondary | ICD-10-CM

## 2018-12-16 DIAGNOSIS — R2242 Localized swelling, mass and lump, left lower limb: Secondary | ICD-10-CM

## 2018-12-16 DIAGNOSIS — L539 Erythematous condition, unspecified: Secondary | ICD-10-CM | POA: Insufficient documentation

## 2018-12-16 DIAGNOSIS — R600 Localized edema: Secondary | ICD-10-CM

## 2018-12-16 NOTE — Progress Notes (Signed)
Patient completed the Prednisone Rx. Hasn't taken any doses of the Lasix or Potassium since his last visit. States that he is still having the redness & swelling in his legs.

## 2018-12-16 NOTE — Progress Notes (Signed)
Patient ID: Bruce Marshall, male    DOB: [birth year], 43 y.o.   MRN: [id number]  PCP: Bing Neighbors, FNP  Chief Complaint  Patient presents with  . Follow-up    f/up BLE redness/edema    Subjective:  HPI  Bruce Marshall is a 43 y.o. male agrees to complete today's encounter via a telephone encounter. Patient is located at home during today's encounter. Patient is at home for today's encounter. Provider is at primary care office for today's encounter.  Bilateral edema and lower extremity erythema follow-up: Bruce Marshall was initially seen in office on 10/26/2018 for ED follow-up.  Patient presented to the ED on 10/02/2018 with a complaint of acute onset leg edema.  Patient underwent bilateral DVT studies which both were negative.  In the ED he was referred to follow-up here in our clinic.  Patient was seen in the office on 10/26/2018 and at that time he did continue to experience some bilateral leg edema which was nonpitting.  During that visit he was placed on furosemide with replacement potassium.  He took only a few doses prior to his follow-up and explained that the medication was not working and he had no increase in urinary production.  He represented to the office on 11/25/2018 and during this visit he complained of localized lower leg redness and bilateral lower leg swelling. Again the swelling was nonpitting however patient did have profound erythema localized to both of his legs.  He had no lesions nor did his legs itch or burn.   burn.  He was placed on a 9-day prednisone taper. During today's telephone visit patient reports that he did complete the prednisone and has not resumed the furosemide.  He reports once the prednisone was completed bilateral leg redness did resolve however recurred a couple days ago.  His legs continue not to itch are nonpainful.  He reports swelling has now localized to his left foot only and the swelling is intermittent.  He reports when he is bearing weight most of  the day he does not experience swelling however if he sits for prolonged period of time his left foot only swells.  He has not noticed any changes in his right foot or right leg.  Patient has no history of heart disease and has had a normal EKG and chest x-Bruce.  He denies any shortness of breath or persistent cough.  Patient is obese with his most current BMI 45.9.  Social History   Socioeconomic History  . Marital status: Married    Spouse name: Not on file  . Number of children: Not on file  . Years of education: Not on file  . Highest education level: Not on file  Occupational History  . Not on file  Social Needs  . Financial resource strain: Not on file  . Food insecurity:    Worry: Not on file    Inability: Not on file  . Transportation needs:    Medical: Not on file    Non-medical: Not on file  Tobacco Use  . Smoking status: Never Smoker  . Smokeless tobacco: Never Used  Substance and Sexual Activity  . Alcohol use: Not on file  . Drug use: Not on file  . Sexual activity: Not on file  Lifestyle  . Physical activity:    Days per week: Not on file    Minutes per session: Not on file  . Stress: Not on file  Relationships  . Social connections:    Talks on phone:  Not on file    Gets together: Not on file    Attends religious service: Not on file    Active member of club or organization: Not on file    Attends meetings of clubs or organizations: Not on file    Relationship status: Not on file  . Intimate partner violence:    Fear of current or ex partner: Not on file    Emotionally abused: Not on file    Physically abused: Not on file    Forced sexual activity: Not on file  Other Topics Concern  . Not on file  Social History Narrative  . Not on file    Family History  Problem Relation Age of Onset  . Healthy Daughter   . Heart attack Maternal Uncle   . Cancer Neg Hx   . Stroke Neg Hx    Review of Systems Pertinent negatives listed in HPI  Patient Active  Problem List   Diagnosis Date Noted  . Lower extremity edema 11/29/2018  . Redness and swelling of lower leg 11/29/2018  . Prediabetes 11/29/2018    No Known Allergies  Prior to Admission medications   Medication Sig Start Date End Date Taking? Authorizing Provider  furosemide (LASIX) 20 MG tablet Take 1 tablet (20 mg total) by mouth daily as needed for fluid or edema (Take one dose of potassium with Furosemide dose). Patient not taking: Reported on 12/16/2018 10/26/18   Bing Neighbors, FNP  potassium chloride (K-DUR) 10 MEQ tablet Take 1 tablet (10 mEq total) by mouth daily as needed (Take one dose with each dose of Lasix as need). Patient not taking: Reported on 12/16/2018 10/26/18   Bing Neighbors, FNP    Past Medical, Surgical Family and Social History reviewed and updated.    Objective:  There were no vitals filed for this visit.  Wt Readings from Last 3 Encounters:  11/25/18 293 lb 6.4 oz (133.1 kg)  10/26/18 (!) 300 lb 9.6 oz (136.4 kg)  10/02/18 (!) 303 lb (137.4 kg)     Physical Exam Photos from visit 11/24/18     Assessment & Plan:  1. Lower extremity edema, left 2. Erythema of lower extremity Patient and I discussed the plan going forward.  Patient has tried and failed furosemide.  He has been placed on extended course of prednisone which only temporarily resolved the redness.  Today he is complaining of new swelling which is localized to the left lower extremity primarily the foot.  Concern for some type of vascular anomaly.  He has had 2- DVT studies.  At this point, I will place a vascular consult for a second opinion.  I have encouraged patient to continue wearing compression socks to reduce the frequency of recurrence of edema.  Patient verbalized agreement and understanding of plan.   A total of 15  minutes spent, obtaining current history, reviewing prior treatments, reviewing current results from recent treatment, discussing a plan going forward and  coordination of care.  -The patient was given clear instructions to go to ER or return to medical center if symptoms do not improve, worsen or new problems develop. The patient verbalized understanding.    Bruce Courts, FNP-C Primary Care at [facility] [address] [place] [address] [phone]: [phone]

## 2019-03-09 IMAGING — DX DG CHEST 2V
2 series · 2 of 2 positions shown · non-contrast
Comparison: None.

CLINICAL DATA: Bilateral lower extremity swelling.

EXAM:
CHEST - 2 VIEW

[chest pa]
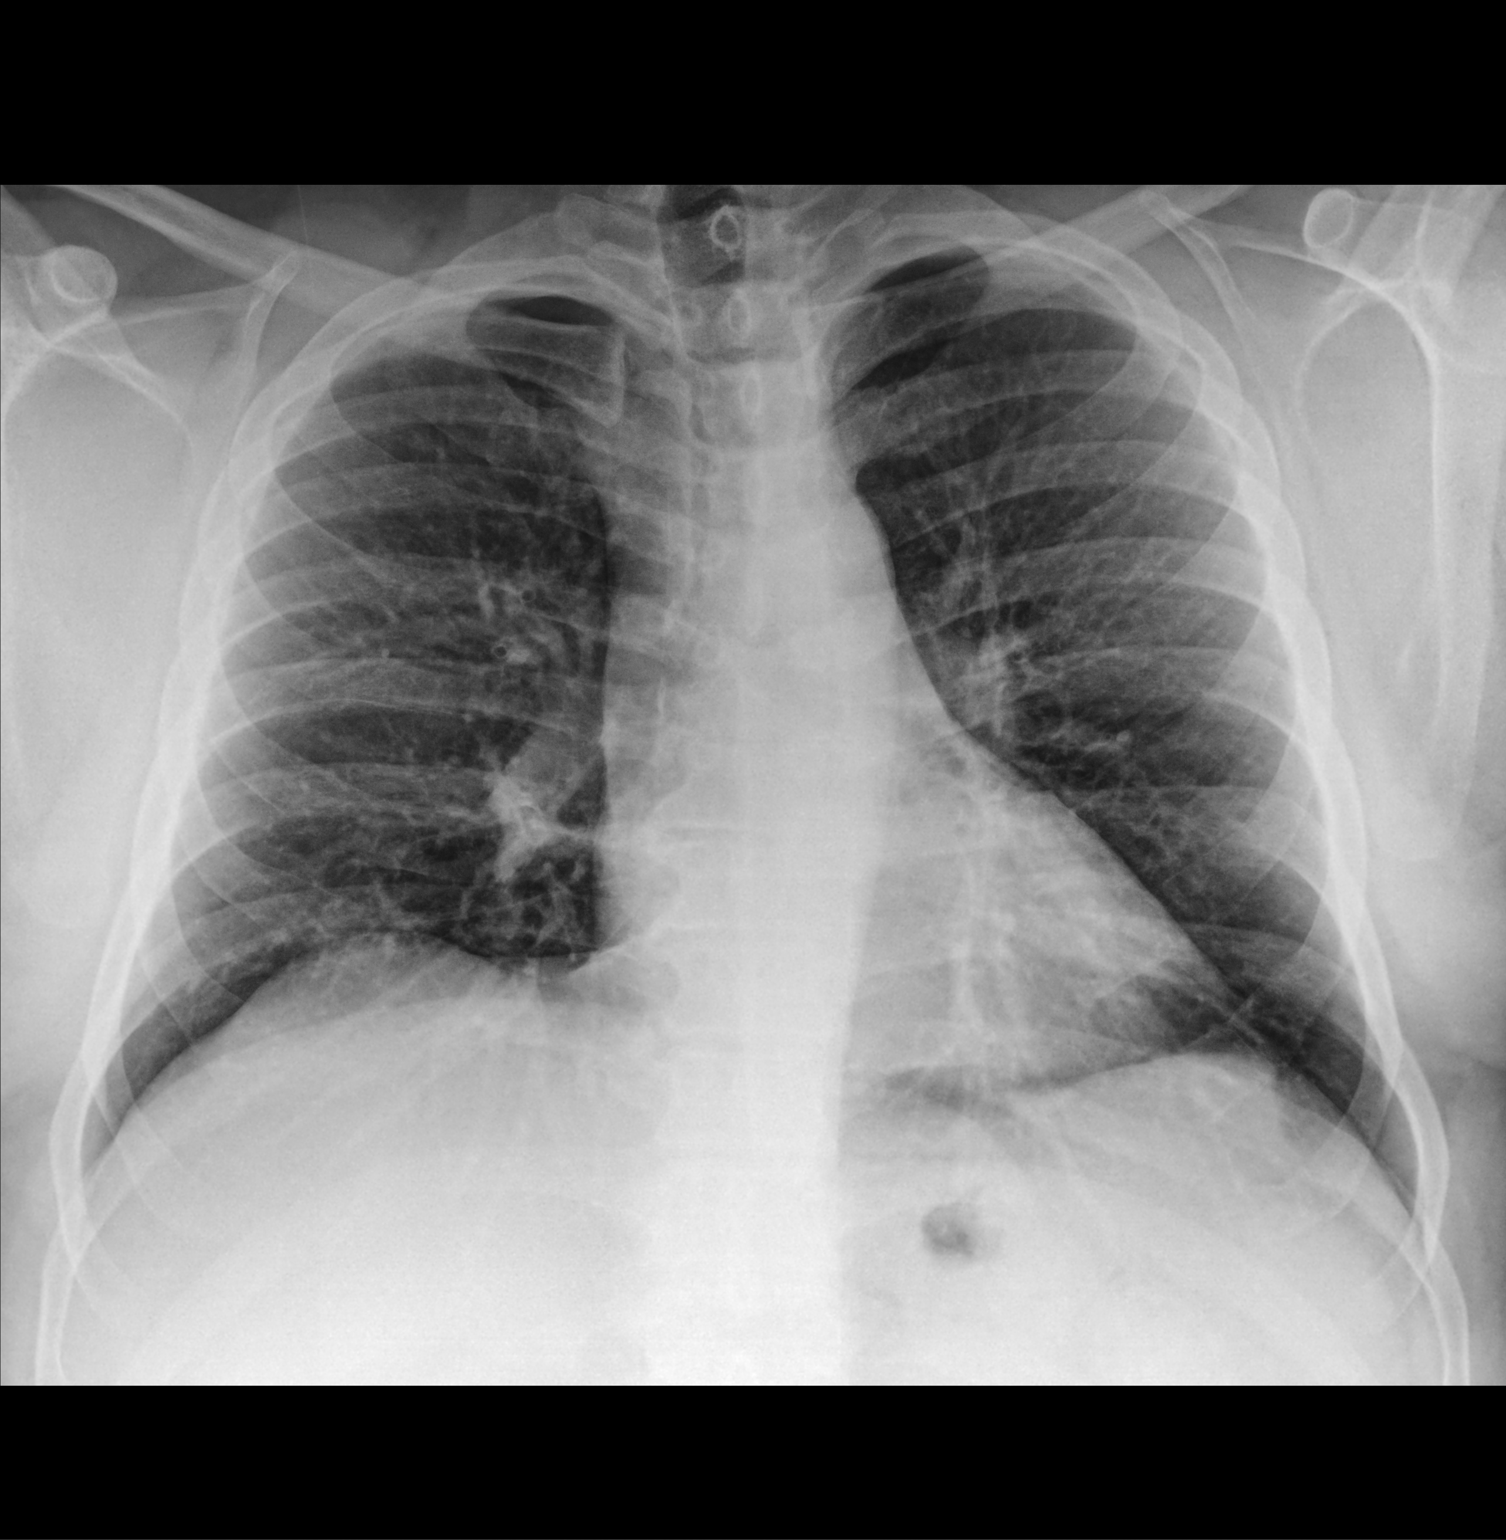

[chest lat]
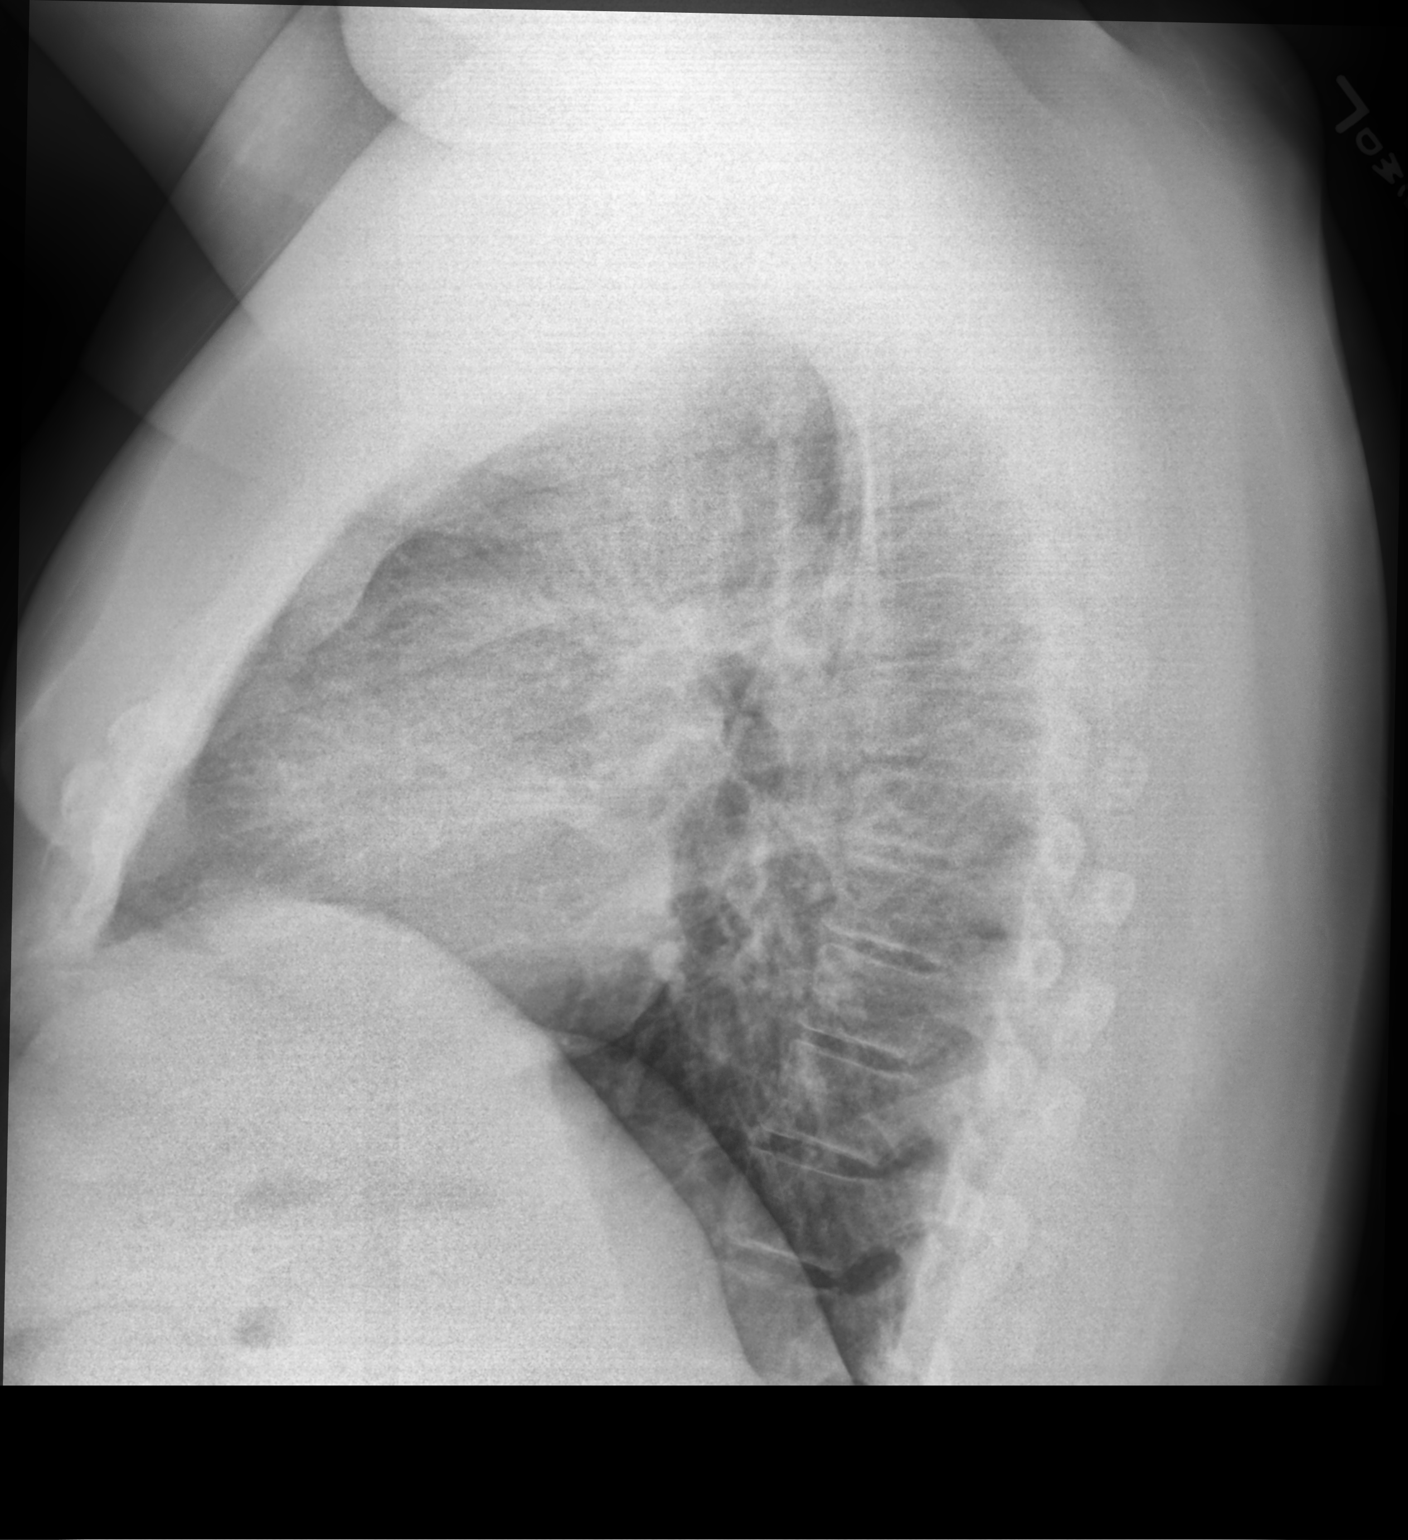

[2 of 2 positions shown; findings below may reference images not displayed]

FINDINGS: Normal heart size and mediastinal contours. No acute infiltrate or
edema. No effusion or pneumothorax. No acute osseous findings.
IMPRESSION: Negative chest.

## 2022-02-04 ENCOUNTER — Ambulatory Visit: Payer: Self-pay

## 2022-02-04 ENCOUNTER — Ambulatory Visit
Admission: EM | Admit: 2022-02-04 | Discharge: 2022-02-04 | Disposition: A | Discharge status: Home / Self Care | Payer: BC Managed Care – PPO | Attending: Family Medicine | Admitting: Family Medicine

## 2022-02-04 DIAGNOSIS — R200 Anesthesia of skin: Secondary | ICD-10-CM

## 2022-02-04 MED ORDER — CYCLOBENZAPRINE HCL 10 MG PO TABS: 10.0000 mg | ORAL_TABLET | Freq: Three times a day (TID) | ORAL | 0 refills | Status: DC | PRN

## 2022-02-04 MED ORDER — NAPROXEN 500 MG PO TABS: 500.0000 mg | ORAL_TABLET | Freq: Two times a day (BID) | ORAL | 0 refills | Status: DC | PRN

## 2022-02-04 NOTE — ED Triage Notes (Addendum)
Patient presents to Urgent Care with complaints of numbness in bilateral hands since 3 days ago. Patient reports also hearing heartbeat in head.  Pt reports he works for a [employer], [employer] and has had contact with chemicals but continues to keep hands clean and no rash present.  Pt denies any other symptoms such as confusion, blurred vision, ha.

## 2022-02-04 NOTE — ED Provider Notes (Signed)
[facility]    CSN: [id number] Arrival date & time: 02/04/22  1058      History   Chief Complaint Chief Complaint  Patient presents with   hand numbness    HPI Bruce Marshall is a 46 y.o. male.   Presenting today with 3-day history of bilateral diffuse hand numbness, pins and needle sensation.  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  Denies swelling, discoloration, weakness, decreased range of motion, known injury, neck pain or soreness, headache, chest pain, shortness of breath, dizziness, nausea vomiting or diarrhea.  Trying anything over-the-counter for symptoms.  Has had similar but milder symptoms in the past and situation such as his daughter falling asleep on one of his arms.   No past medical history on file.  Patient Active Problem List   Diagnosis Date Noted   Erythema of lower extremity 12/16/2018   Lower extremity edema 11/29/2018   Redness and swelling of lower leg 11/29/2018   Prediabetes 11/29/2018    No past surgical history on file.     Home Medications    Prior to Admission medications   Medication Sig Start Date End Date Taking? Authorizing Provider  cyclobenzaprine (FLEXERIL) 10 MG tablet Take 1 tablet (10 mg total) by mouth 3 (three) times daily as needed for muscle spasms. Do not drink alcohol or drive while taking this medication.  May cause drowsiness. 02/04/22  Yes Volney American, PA-C  naproxen (NAPROSYN) 500 MG tablet Take 1 tablet (500 mg total) by mouth 2 (two) times daily as needed. 02/04/22  Yes Volney American, PA-C  furosemide (LASIX) 20 MG tablet Take 1 tablet (20 mg total) by mouth daily as needed for fluid or edema (Take one dose of potassium with Furosemide dose). Patient not taking: Reported on 12/16/2018 10/26/18    Scot Jun, FNP  potassium chloride (K-DUR) 10 MEQ tablet Take 1 tablet (10 mEq total) by mouth daily as needed (Take one dose with each dose of Lasix as need). Patient not taking: Reported on 12/16/2018 10/26/18   Scot Jun, FNP    Family History Family History  Problem Relation Age of Onset   Healthy Daughter    Heart attack Maternal Uncle    Cancer Neg Hx    Stroke Neg Hx     Social History Social History   Tobacco Use   Smoking status: Never   Smokeless tobacco: Never     Allergies   Patient has no known allergies.   Review of Systems Review of Systems Per HPI  Physical Exam Triage Vital Signs ED Triage Vitals  Enc Vitals Group     BP 02/04/22 1126 (!) 146/87     Pulse Rate 02/04/22 1126 84     Resp 02/04/22 1126 (!) 22     Temp 02/04/22 1126 98.2 F (36.8 C)     Temp src --      SpO2 02/04/22 1126 96 %     Weight --      Height --      Head Circumference --      Peak Flow --      Pain Score 02/04/22 1125 0     Pain Loc --      Pain  Edu? --      Excl. in [place]? --    No data found.  Updated Vital Signs BP (!) 146/87   Pulse 84   Temp 98.2 F (36.8 C)   Resp (!) 22   SpO2 96%   Visual Acuity Right Eye Distance:   Left Eye Distance:   Bilateral Distance:    Right Eye Near:   Left Eye Near:    Bilateral Near:     Physical Exam Vitals and nursing note reviewed.  Constitutional:      Appearance: Normal appearance.  HENT:     Head: Atraumatic.     Mouth/Throat:     Mouth: Mucous membranes are moist.  Eyes:     Extraocular Movements: Extraocular movements intact.     Conjunctiva/sclera: Conjunctivae normal.  Cardiovascular:     Rate and Rhythm: Normal rate and regular rhythm.  Pulmonary:     Effort: Pulmonary effort is normal.     Breath sounds: Normal breath sounds.  Musculoskeletal:        General: Tenderness present. No swelling, deformity or signs of injury. Normal range of motion.     Cervical back: Normal range of  motion and neck supple.     Comments: Mild tenderness to palpation bilateral forearms and flexor surface of wrists  Skin:    General: Skin is warm and dry.     Findings: No bruising or erythema.  Neurological:     General: No focal deficit present.     Mental Status: He is oriented to person, place, and time.     Sensory: No sensory deficit.     Motor: No weakness.     Gait: Gait normal.  Psychiatric:        Mood and Affect: Mood normal.        Thought Content: Thought content normal.        Judgment: Judgment normal.     UC Treatments / Results  Labs (all labs ordered are listed, but only abnormal results are displayed) Labs Reviewed - No data to display  EKG   Radiology No results found.  Procedures Procedures (including critical care time)  Medications Ordered in UC Medications - No data to display  Initial Impression / Assessment and Plan / UC Course  I have reviewed the triage vital signs and the nursing notes.  Pertinent labs & imaging results that were available during my care of the patient were reviewed by me and considered in my medical decision making (see chart for details).     Suspect some tendinitis or impingement etiology.  Treat with naproxen, Flexeril, stretches, heat, massage, rest.  Return for any worsening symptoms.  Final Clinical Impressions(s) / UC Diagnoses   Final diagnoses:  Hand numbness   Discharge Instructions   None    ED Prescriptions     Medication Sig Dispense Auth. Provider   naproxen (NAPROSYN) 500 MG tablet Take 1 tablet (500 mg total) by mouth 2 (two) times daily as needed. 30 tablet Volney American, [place]   cyclobenzaprine (FLEXERIL) 10 MG tablet Take 1 tablet (10 mg total) by mouth 3 (three) times daily as needed for muscle spasms. Do not drink alcohol or drive while taking this medication.  May cause drowsiness. 15 tablet Volney American, [place]      PDMP not reviewed this encounter.   Volney American, [place] 02/04/22 1203

## 2022-02-04 NOTE — Telephone Encounter (Signed)
  Chief Complaint: Numb hands Symptoms: Bilateral numb hands Frequency: 2-3 days Pertinent Negatives: Patient denies Any other neuro deficits. Disposition: [] ED /[x] Urgent Care (no appt availability in office) / [] Appointment(In office/virtual)/ []  [facility]/ [] Home Care/ [] Refused Recommended Disposition /[] [facility]/ []  Follow-up with PCP Additional Notes: Pt states that both hands are tingling like they are asleep. This has been ongoing for 2-3 days.   Reason for Disposition  [1] Numbness or tingling on both sides of body AND [2] is a new symptom present > 24 hours  Answer Assessment - Initial Assessment Questions 1. SYMPTOM: "What is the main symptom you are concerned about?" (e.g., weakness, numbness)     Hands are tingling 2. ONSET: "When did this start?" (minutes, hours, days; while sleeping)     2-3 days ago 3. LAST NORMAL: "When was the last time you (the patient) were normal (no symptoms)?"     4 days ago 4. PATTERN "Does this come and go, or has it been constant since it started?"  "Is it present now?"     constant 5. CARDIAC SYMPTOMS: "Have you had any of the following symptoms: chest pain, difficulty breathing, palpitations?"     no 6. NEUROLOGIC SYMPTOMS: "Have you had any of the following symptoms: headache, dizziness, vision loss, double vision, changes in speech, unsteady on your feet?"     no 7. OTHER SYMPTOMS: "Do you have any other symptoms?"     no 8. PREGNANCY: "Is there any chance you are pregnant?" "When was your last menstrual period?"     Na  Protocols used: Neurologic Deficit-A-AH

## 2022-05-09 ENCOUNTER — Ambulatory Visit (INDEPENDENT_AMBULATORY_CARE_PROVIDER_SITE_OTHER): Payer: BC Managed Care – PPO | Admitting: Family Medicine

## 2022-05-09 ENCOUNTER — Encounter: Payer: Self-pay | Admitting: Family Medicine

## 2022-05-09 VITALS — BP 131/83 | HR 84 | Temp 98.1°F | Resp 16 | Ht 66.5 in | Wt 316.0 lb

## 2022-05-09 DIAGNOSIS — Z6841 Body Mass Index (BMI) 40.0 and over, adult: Secondary | ICD-10-CM

## 2022-05-09 DIAGNOSIS — Z13220 Encounter for screening for lipoid disorders: Secondary | ICD-10-CM

## 2022-05-09 DIAGNOSIS — Z130 Encounter for screening for diseases of the blood and blood-forming organs and certain disorders involving the immune mechanism: Secondary | ICD-10-CM

## 2022-05-09 DIAGNOSIS — Z0000 Encounter for general adult medical examination without abnormal findings: Secondary | ICD-10-CM | POA: Diagnosis not present

## 2022-05-09 DIAGNOSIS — Z1159 Encounter for screening for other viral diseases: Secondary | ICD-10-CM

## 2022-05-09 DIAGNOSIS — Z7689 Persons encountering health services in other specified circumstances: Secondary | ICD-10-CM

## 2022-05-09 DIAGNOSIS — Z114 Encounter for screening for human immunodeficiency virus [HIV]: Secondary | ICD-10-CM

## 2022-05-09 DIAGNOSIS — Z1211 Encounter for screening for malignant neoplasm of colon: Secondary | ICD-10-CM

## 2022-05-10 ENCOUNTER — Encounter: Payer: Self-pay | Admitting: Family Medicine

## 2022-05-10 LAB — CMP14+EGFR
ALT: 22 IU/L (ref 0–44)
AST: 16 IU/L (ref 0–40)
Albumin/Globulin Ratio: 1.2 (ref 1.2–2.2)
Albumin: 4.1 g/dL (ref 4.1–5.1)
Alkaline Phosphatase: 95 IU/L (ref 44–121)
BUN/Creatinine Ratio: 16 (ref 9–20)
BUN: 16 mg/dL (ref 6–24)
Bilirubin Total: 0.3 mg/dL (ref 0.0–1.2)
CO2: 28 mmol/L (ref 20–29)
Calcium: 9 mg/dL (ref 8.7–10.2)
Chloride: 101 mmol/L (ref 96–106)
Creatinine, Ser: 0.98 mg/dL (ref 0.76–1.27)
Globulin, Total: 3.3 g/dL (ref 1.5–4.5)
Glucose: 112 mg/dL — ABNORMAL HIGH (ref 70–99)
Potassium: 4.3 mmol/L (ref 3.5–5.2)
Sodium: 141 mmol/L (ref 134–144)
Total Protein: 7.4 g/dL (ref 6.0–8.5)
eGFR: 95 mL/min/{1.73_m2} (ref >59)

## 2022-05-10 LAB — CBC WITH DIFFERENTIAL/PLATELET
Basophils Absolute: 0 10*3/uL (ref 0.0–0.2)
Basos: 0 %
EOS (ABSOLUTE): 0.1 10*3/uL (ref 0.0–0.4)
Eos: 2 %
Hematocrit: 49.3 % (ref 37.5–51.0)
Hemoglobin: 15.9 g/dL (ref 13.0–17.7)
Immature Grans (Abs): 0 10*3/uL (ref 0.0–0.1)
Immature Granulocytes: 0 %
Lymphocytes Absolute: 1.5 10*3/uL (ref 0.7–3.1)
Lymphs: 23 %
MCH: 27.6 pg (ref 26.6–33.0)
MCHC: 32.3 g/dL (ref 31.5–35.7)
MCV: 85 fL (ref 79–97)
Monocytes Absolute: 0.6 10*3/uL (ref 0.1–0.9)
Monocytes: 9 %
Neutrophils Absolute: 4.4 10*3/uL (ref 1.4–7.0)
Neutrophils: 66 %
Platelets: 235 10*3/uL (ref 150–450)
RBC: 5.77 x10E6/uL (ref 4.14–5.80)
RDW: 14.9 % (ref 11.6–15.4)
WBC: 6.7 10*3/uL (ref 3.4–10.8)

## 2022-05-10 LAB — LIPID PANEL
Chol/HDL Ratio: 4.1 ratio (ref 0.0–5.0)
Cholesterol, Total: 174 mg/dL (ref 100–199)
HDL: 42 mg/dL (ref >39)
LDL Chol Calc (NIH): 107 mg/dL — ABNORMAL HIGH (ref 0–99)
Triglycerides: 139 mg/dL (ref 0–149)
VLDL Cholesterol Cal: 25 mg/dL (ref 5–40)

## 2022-05-10 LAB — HIV ANTIBODY (ROUTINE TESTING W REFLEX): HIV Screen 4th Generation wRfx: NONREACTIVE

## 2022-05-10 LAB — HEPATITIS C ANTIBODY: Hep C Virus Ab: NONREACTIVE

## 2022-05-10 LAB — TSH: TSH: 1.59 u[IU]/mL (ref 0.450–4.500)

## 2022-05-10 NOTE — Progress Notes (Signed)
New Patient Office Visit  Subjective    Patient ID: Bruce Marshall, male    DOB: 30-Oct-1975  Age: 46 y.o. MRN: [id number]  CC:  Chief Complaint  Patient presents with   Annual Exam    HPI Bruce Marshall presents to establish care and for a routine annual exam. Patient denies acute complaints or concerns.    Outpatient Encounter Medications as of 05/09/2022  Medication Sig   BINAXNOW COVID-19 AG HOME TEST KIT TEST AS DIRECTED TODAY   cyclobenzaprine (FLEXERIL) 10 MG tablet Take 1 tablet (10 mg total) by mouth 3 (three) times daily as needed for muscle spasms. Do not drink alcohol or drive while taking this medication.  May cause drowsiness.   naproxen (NAPROSYN) 500 MG tablet Take 1 tablet (500 mg total) by mouth 2 (two) times daily as needed.   furosemide (LASIX) 20 MG tablet Take 1 tablet (20 mg total) by mouth daily as needed for fluid or edema (Take one dose of potassium with Furosemide dose). (Patient not taking: Reported on 12/16/2018)   potassium chloride (K-DUR) 10 MEQ tablet Take 1 tablet (10 mEq total) by mouth daily as needed (Take one dose with each dose of Lasix as need). (Patient not taking: Reported on 12/16/2018)   No facility-administered encounter medications on file as of 05/09/2022.    No past medical history on file.  No past surgical history on file.  Family History  Problem Relation Age of Onset   Healthy Daughter    Heart attack Maternal Uncle    Cancer Neg Hx    Stroke Neg Hx     Social History   Socioeconomic History   Marital status: Married    Spouse name: Not on file   Number of children: Not on file   Years of education: Not on file   Highest education level: Not on file  Occupational History   Not on file  Tobacco Use   Smoking status: Never   Smokeless tobacco: Never  Substance and Sexual Activity   Alcohol use: Not on file   Drug use: Not on file   Sexual activity: Not on file  Other Topics Concern   Not on file  Social History  Narrative   Not on file   Social Determinants of Health   Financial Resource Strain: Not on file  Food Insecurity: Not on file  Transportation Needs: Not on file  Physical Activity: Not on file  Stress: Not on file  Social Connections: Not on file  Intimate Partner Violence: Not on file    Review of Systems  All other systems reviewed and are negative.       Objective    BP 131/83   Pulse 84   Temp 98.1 F (36.7 C) (Oral)   Resp 16   Ht 5' 6.5" (1.689 m)   Wt (!) 316 lb (143.3 kg)   SpO2 96%   BMI 50.24 kg/m   Physical Exam Vitals and nursing note reviewed.  Constitutional:      General: He is not in acute distress. HENT:     Head: Normocephalic and atraumatic.     Right Ear: Tympanic membrane, ear canal and external ear normal.     Left Ear: Tympanic membrane, ear canal and external ear normal.     Nose: Nose normal.     Mouth/Throat:     Mouth: Mucous membranes are moist.     Pharynx: Oropharynx is clear.  Eyes:     Conjunctiva/sclera: Conjunctivae  normal.     Pupils: Pupils are equal, round, and reactive to light.  Neck:     Thyroid: No thyromegaly.  Cardiovascular:     Rate and Rhythm: Normal rate and regular rhythm.     Heart sounds: Normal heart sounds. No murmur heard. Pulmonary:     Effort: Pulmonary effort is normal.     Breath sounds: Normal breath sounds.  Abdominal:     General: There is no distension.     Palpations: Abdomen is soft. There is no mass.     Tenderness: There is no abdominal tenderness.     Hernia: There is no hernia in the left inguinal area or right inguinal area.  Genitourinary:    Penis: Normal.      Testes: Normal.  Musculoskeletal:        General: Normal range of motion.     Cervical back: Normal range of motion and neck supple.     Right lower leg: No edema.     Left lower leg: No edema.  Skin:    General: Skin is warm and dry.  Neurological:     General: No focal deficit present.     Mental Status: He is  alert and oriented to person, place, and time. Mental status is at baseline.  Psychiatric:        Mood and Affect: Mood normal.        Behavior: Behavior normal.         Assessment & Plan:   1. Annual physical exam  - CMP14+EGFR  2. Screening for deficiency anemia  - CBC with Differential  3. Screening for lipid disorders  - Lipid Panel  4. Screening for endocrine/metabolic/immunity disorders  - TSH  5. Screening for colon cancer  - Cologuard  6. Screening for HIV (human immunodeficiency virus)  - HIV antibody (with reflex)  7. Need for hepatitis C screening test  - Hepatitis C Antibody  8. Encounter to establish care   No follow-ups on file.   Becky Sax, MD

## 2022-06-06 LAB — COLOGUARD: COLOGUARD: POSITIVE — AB

## 2022-06-10 ENCOUNTER — Other Ambulatory Visit: Payer: Self-pay | Admitting: Family Medicine

## 2022-06-10 DIAGNOSIS — R195 Other fecal abnormalities: Secondary | ICD-10-CM

## 2022-06-20 ENCOUNTER — Encounter: Payer: Self-pay | Admitting: Internal Medicine

## 2022-07-03 ENCOUNTER — Telehealth: Payer: Self-pay | Admitting: *Deleted

## 2022-07-03 NOTE — Telephone Encounter (Signed)
Bruce Marshall,  This pt's BMI disqualifies her for anesthetic care at [facility].  Thanks,  Osvaldo Angst

## 2022-07-08 NOTE — Telephone Encounter (Signed)
I have called patient and left a message for him to call back regarding rescheduling. I will attempt to call back later.

## 2022-07-09 NOTE — Telephone Encounter (Signed)
I have called patient this morning to cancel previsit appointment and schedule office visit. I have left a message for patient to call back.

## 2022-07-12 ENCOUNTER — Ambulatory Visit
Admission: EM | Admit: 2022-07-12 | Discharge: 2022-07-12 | Disposition: A | Discharge status: Home / Self Care | Payer: BC Managed Care – PPO | Attending: Physician Assistant | Admitting: Physician Assistant

## 2022-07-12 DIAGNOSIS — R079 Chest pain, unspecified: Secondary | ICD-10-CM

## 2022-07-12 LAB — POCT URINALYSIS DIP (MANUAL ENTRY)
Bilirubin, UA: NEGATIVE
Glucose, UA: NEGATIVE mg/dL
Ketones, POC UA: NEGATIVE mg/dL
Leukocytes, UA: NEGATIVE
Nitrite, UA: NEGATIVE
Protein Ur, POC: 100 mg/dL — AB
Spec Grav, UA: 1.025 (ref 1.010–1.025)
Urobilinogen, UA: 0.2 E.U./dL
pH, UA: 6 (ref 5.0–8.0)

## 2022-07-12 MED ORDER — PREDNISONE 20 MG PO TABS: 40.0000 mg | ORAL_TABLET | Freq: Every day | ORAL | 0 refills | Status: AC

## 2022-07-12 NOTE — ED Triage Notes (Signed)
Pt presents with right side rib pain & flank pain with no known injury.

## 2022-07-12 NOTE — ED Provider Notes (Signed)
[facility]    CSN: [id number] Arrival date & time: 07/12/22  0805      History   Chief Complaint Chief Complaint  Patient presents with   Flank Pain    HPI Bruce Marshall is a 46 y.o. male.   Patient here today for evaluation of right-sided rib pain and flank pain that started about 5 days ago.  He reports that movement and lying on his right side seems to worsen pain.  Pain is not associated with eating and he has not had any nausea or vomiting.  He does have some pain with deep breathing as well.  He has not had any fever.  He does not report treatment for symptoms.  He denies any injury.  The history is provided by the patient.  Flank Pain Associated symptoms include chest pain. Pertinent negatives include no shortness of breath.    History reviewed. No pertinent past medical history.  Patient Active Problem List   Diagnosis Date Noted   Erythema of lower extremity 12/16/2018   Lower extremity edema 11/29/2018   Redness and swelling of lower leg 11/29/2018   Prediabetes 11/29/2018    History reviewed. No pertinent surgical history.     Home Medications    Prior to Admission medications   Medication Sig Start Date End Date Taking? Authorizing Provider  predniSONE (DELTASONE) 20 MG tablet Take 2 tablets (40 mg total) by mouth daily with breakfast for 5 days. 07/12/22 07/17/22 Yes Francene Finders, PA-C  BINAXNOW COVID-19 [place] TEST KIT TEST AS DIRECTED TODAY 01/16/22   [provider]  cyclobenzaprine (FLEXERIL) 10 MG tablet Take 1 tablet (10 mg total) by mouth 3 (three) times daily as needed for muscle spasms. Do not drink alcohol or drive while taking this medication.  May cause drowsiness. 02/04/22   Volney American, PA-C  furosemide (LASIX) 20 MG tablet Take 1 tablet (20 mg total) by mouth daily as needed for fluid or edema (Take one dose of potassium with Furosemide dose). Patient not taking: Reported on 12/16/2018 10/26/18   Scot Jun, FNP  naproxen (NAPROSYN) 500 MG tablet Take 1 tablet (500 mg total) by mouth 2 (two) times daily as needed. 02/04/22   Volney American, PA-C  potassium chloride (K-DUR) 10 MEQ tablet Take 1 tablet (10 mEq total) by mouth daily as needed (Take one dose with each dose of Lasix as need). Patient not taking: Reported on 12/16/2018 10/26/18   Scot Jun, FNP    Family History Family History  Problem Relation Age of Onset   Healthy Daughter    Heart attack Maternal Uncle    Cancer Neg Hx    Stroke Neg Hx     Social History Social History   Tobacco Use   Smoking status: Never   Smokeless tobacco: Never     Allergies   Patient has no known allergies.   Review of Systems Review of Systems  Constitutional:  Negative for chills and fever.  Eyes:  Negative for discharge and redness.  Respiratory:  Negative for shortness of breath.   Cardiovascular:  Positive for chest pain.  Gastrointestinal:  Negative for nausea and vomiting.  Genitourinary:  Positive for flank pain.  Neurological:  Negative for numbness.     Physical Exam Triage Vital Signs ED Triage Vitals  Enc Vitals Group     BP 07/12/22 0826 124/89     Pulse Rate 07/12/22 0826 97     Resp 07/12/22 0826  18     Temp 07/12/22 0826 98 F (36.7 C)     Temp Source 07/12/22 0826 Oral     SpO2 07/12/22 0826 97 %     Weight --      Height --      Head Circumference --      Peak Flow --      Pain Score 07/12/22 0827 6     Pain Loc --      Pain Edu? --      Excl. in [place]? --    No data found.  Updated Vital Signs BP 124/89 (BP Location: Left Arm)   Pulse 97   Temp 98 F (36.7 C) (Oral)   Resp 18   SpO2 97%       Physical Exam Vitals and nursing note reviewed.  Constitutional:      General: He is not in acute distress.    Appearance: Normal appearance. He is not ill-appearing.  HENT:     Head: Normocephalic and atraumatic.  Eyes:     Conjunctiva/sclera: Conjunctivae normal.   Cardiovascular:     Rate and Rhythm: Normal rate and regular rhythm.     Heart sounds: Normal heart sounds.  Pulmonary:     Effort: Pulmonary effort is normal. No respiratory distress.     Breath sounds: Normal breath sounds. No wheezing, rhonchi or rales.  Chest:     Chest wall: Tenderness (mild TTP to right lateral lower ribs) present.  Neurological:     Mental Status: He is alert.  Psychiatric:        Mood and Affect: Mood normal.        Behavior: Behavior normal.        Thought Content: Thought content normal.      UC Treatments / Results  Labs (all labs ordered are listed, but only abnormal results are displayed) Labs Reviewed  POCT URINALYSIS DIP (MANUAL ENTRY) - Abnormal; Notable for the following components:      Result Value   Blood, UA moderate (*)    Protein Ur, POC =100 (*)    All other components within normal limits    EKG   Radiology No results found.  Procedures Procedures (including critical care time)  Medications Ordered in UC Medications - No data to display  Initial Impression / Assessment and Plan / UC Course  I have reviewed the triage vital signs and the nursing notes.  Pertinent labs & imaging results that were available during my care of the patient were reviewed by me and considered in my medical decision making (see chart for details).    Hematuria noted but patient reports that this has been present and he has had full workup by urology without explanation. Low suspicion of kidney stone but did discuss same. Suspect likely muscular strain vs pleurisy.  Will treat with steroid burst.  Offered muscle relaxer but patient kindly declines.  Encouraged follow-up if no gradual improvement or with any further concerns.  Recommend emergency room evaluation with any worsening symptoms.  Final Clinical Impressions(s) / UC Diagnoses   Final diagnoses:  Right-sided chest pain   Discharge Instructions   None    ED Prescriptions      Medication Sig Dispense Auth. Provider   predniSONE (DELTASONE) 20 MG tablet Take 2 tablets (40 mg total) by mouth daily with breakfast for 5 days. 10 tablet Francene Finders, PA-C      PDMP not reviewed this encounter.   Francene Finders,  PA-C 07/12/22 1001

## 2022-07-15 ENCOUNTER — Encounter: Payer: Self-pay | Admitting: Internal Medicine

## 2022-08-13 ENCOUNTER — Encounter: Payer: BC Managed Care – PPO | Admitting: Internal Medicine

## 2022-09-18 ENCOUNTER — Ambulatory Visit: Payer: BC Managed Care – PPO | Admitting: Internal Medicine

## 2022-09-18 ENCOUNTER — Encounter: Payer: Self-pay | Admitting: Internal Medicine

## 2022-09-18 VITALS — BP 122/78 | HR 88 | Ht 66.5 in | Wt 319.5 lb

## 2022-09-18 DIAGNOSIS — R1032 Left lower quadrant pain: Secondary | ICD-10-CM

## 2022-09-18 DIAGNOSIS — M25552 Pain in left hip: Secondary | ICD-10-CM

## 2022-09-18 DIAGNOSIS — R194 Change in bowel habit: Secondary | ICD-10-CM

## 2022-09-18 DIAGNOSIS — R195 Other fecal abnormalities: Secondary | ICD-10-CM

## 2022-09-18 MED ORDER — NA SULFATE-K SULFATE-MG SULF 17.5-3.13-1.6 GM/177ML PO SOLN: 1.0000 | ORAL | 0 refills | Status: DC

## 2022-09-18 NOTE — Progress Notes (Signed)
Patient ID: Bruce Marshall, male   DOB: [birth year], 47 y.o.   MRN: [id number] HPI: Bruce Marshall is a 47 year old male with a history of obesity who is seen in consult at the request of Dr. Redmond Pulling to evaluate positive Cologuard and change in bowel habit along with left groin pain.  He is here alone today.  He reports that he had a positive Cologuard several months ago.  This test was ordered for colon cancer screening by primary care.  He has multiple questions regarding what this test means.  He also reports a change in his bowel habit over the last year.  He notices that if he eats in the morning he will have loose stools about an hour after eating.  This has caused him to avoid eating breakfast and delaying his first meal till later in the day.  When he does this he does not seem to have the loose stools.  He is having about 2 bowel movements per day.  No visible blood or melena.  He does report loose stools that he has milk.  No abdominal pain.  No heartburn, dysphagia or odynophagia.  No nausea or vomiting.  He is having some minor tooth pain after wisdom tooth extraction in early December 2023.  He notices for the last 2 or 3 months left groin pain radiating towards the testicle.  He notices this with movement or if he bangs his leg while walking.  Tends to bother him most with internal rotation of the left leg.  Prior cystoscopy negative for hematuria.  No family history of colon cancer.  His mother has kidney disease.  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  3 children.  No alcohol intake.  Never a tobacco user.  History reviewed. No pertinent past medical history.  Past Surgical History:  Procedure Laterality Date   PILONIDAL CYST DRAINAGE     WRIST SURGERY Right     Outpatient Medications Prior to Visit  Medication Sig Dispense Refill   BINAXNOW COVID-19 [place] HOME TEST KIT TEST AS DIRECTED TODAY     furosemide (LASIX) 20 MG tablet Take 20 mg by mouth as needed.  (Patient not taking: Reported on 09/18/2022)     naproxen (NAPROSYN) 500 MG tablet Take 1 tablet (500 mg total) by mouth 2 (two) times daily as needed. (Patient not taking: Reported on 09/18/2022) 30 tablet 0   potassium chloride (K-DUR) 10 MEQ tablet Take 1 tablet (10 mEq total) by mouth daily as needed (Take one dose with each dose of Lasix as need). (Patient not taking: Reported on 12/16/2018) 30 tablet 0   cyclobenzaprine (FLEXERIL) 10 MG tablet Take 1 tablet (10 mg total) by mouth 3 (three) times daily as needed for muscle spasms. Do not drink alcohol or drive while taking this medication.  May cause drowsiness. 15 tablet 0   furosemide (LASIX) 20 MG tablet Take 1 tablet (20 mg total) by mouth daily as needed for fluid or edema (Take one dose of potassium with Furosemide dose). (Patient not taking: Reported on 12/16/2018) 30 tablet 3   No facility-administered medications prior to visit.    No Known Allergies  Family History  Problem Relation Age of Onset   Healthy Daughter    Heart attack Maternal Uncle    Cancer Neg Hx    Stroke Neg Hx    Colon cancer Neg Hx    Esophageal cancer Neg Hx     Social History   Tobacco Use  Smoking status: Never   Smokeless tobacco: Never  Vaping Use   Vaping Use: Never used  Substance Use Topics   Alcohol use: Not Currently   Drug use: Not Currently    ROS: As per history of present illness, otherwise negative  BP 122/78   Pulse 88   Ht 5' 6.5" (1.689 m)   Wt (!) 319 lb 8 oz (144.9 kg)   BMI 50.80 kg/m  Gen: awake, alert, NAD HEENT: anicteric  CV: RRR, no mrg Pulm: CTA b/l Abd: soft, obese, NT/ND, +BS throughout Ext: no c/c/e Neuro: nonfocal   RELEVANT LABS AND IMAGING: CBC    Component Value Date/Time   WBC 6.7 05/09/2022 0953   WBC 9.4 10/02/2018 1338   RBC 5.77 05/09/2022 0953   RBC 6.02 (H) 10/02/2018 1338   HGB 15.9 05/09/2022 0953   HCT 49.3 05/09/2022 0953   PLT 235 05/09/2022 0953   MCV 85 05/09/2022 0953   MCH 27.6  05/09/2022 0953   MCH 26.2 10/02/2018 1338   MCHC 32.3 05/09/2022 0953   MCHC 30.4 10/02/2018 1338   RDW 14.9 05/09/2022 0953   LYMPHSABS 1.5 05/09/2022 0953   MONOABS 0.9 10/02/2018 1338   EOSABS 0.1 05/09/2022 0953   BASOSABS 0.0 05/09/2022 0953    CMP     Component Value Date/Time   NA 141 05/09/2022 0953   K 4.3 05/09/2022 0953   CL 101 05/09/2022 0953   CO2 28 05/09/2022 0953   GLUCOSE 112 (H) 05/09/2022 0953   GLUCOSE 97 10/02/2018 1338   BUN 16 05/09/2022 0953   CREATININE 0.98 05/09/2022 0953   CALCIUM 9.0 05/09/2022 0953   PROT 7.4 05/09/2022 0953   ALBUMIN 4.1 05/09/2022 0953   AST 16 05/09/2022 0953   ALT 22 05/09/2022 0953   ALKPHOS 95 05/09/2022 0953   BILITOT 0.3 05/09/2022 0953   GFRNONAA >60 10/02/2018 1338   GFRAA >60 10/02/2018 1338   Cologuard +   ASSESSMENT/PLAN: 47 year old male with a history of obesity who is seen in consult at the request of Dr. Redmond Pulling to evaluate positive Cologuard and change in bowel habit along with left groin pain.    + Cologuard --colonoscopy recommended.  Will need to perform this in the outpatient hospital setting due to BMI greater than 50.  We discussed colonoscopy including the prep, risk, benefits and alternatives and he is agreeable and wishes to proceed.  2.  Change in bowel habit --looser stool postprandially in the morning.  Unclear if this is related to dietary intake, dairy intolerance or other. -- Colonoscopy as above  3.  Left groin pain --with internal rotation, movement only.  Does not hurt him to lift heavy objects or externally rotate.  Query hip pathology -- Referral to Dr. Tamala Julian with sports medicine      Cc:Dorna Mai, [place] [address],  [place] [address]

## 2022-09-18 NOTE — Patient Instructions (Addendum)
We have sent the following medications to your pharmacy for you to pick up at your convenience: [place] have been scheduled for a colonoscopy. Please follow written instructions given to you at your visit today.  Please pick up your prep supplies at the pharmacy within the next 1-3 days. If you use inhalers (even only as needed), please bring them with you on the day of your procedure.  Due to recent changes in healthcare laws, you may see the results of your imaging and laboratory studies on MyChart before your provider has had a chance to review them.  We understand that in some cases there may be results that are confusing or concerning to you. Not all laboratory results come back in the same time frame and the provider may be waiting for multiple results in order to interpret others.  Please give [place] 48 hours in order for your provider to thoroughly review all the results before contacting the office for clarification of your results.   You have been referred to [facility]. His office will contact to you schedule appointment. If you have not heard from their office in 1-2 weeks please contact them at [phone].   Thank you for choosing me and [facility].  Dr.Jay [name]

## 2022-10-15 ENCOUNTER — Encounter (HOSPITAL_COMMUNITY): Payer: Self-pay | Admitting: Internal Medicine

## 2022-10-15 NOTE — Progress Notes (Signed)
Attempted to obtain medical history via telephone, unable to reach at this time. HIPAA compliant voicemail message left requesting return call to pre surgical testing department.  

## 2022-10-22 ENCOUNTER — Encounter (HOSPITAL_COMMUNITY)
Admission: RE | Disposition: A | Discharge status: Still In Hospital | Payer: Self-pay | Source: Home / Self Care | Attending: Internal Medicine

## 2022-10-22 ENCOUNTER — Other Ambulatory Visit: Payer: Self-pay

## 2022-10-22 ENCOUNTER — Encounter (HOSPITAL_COMMUNITY): Payer: Self-pay

## 2022-10-22 ENCOUNTER — Emergency Department (HOSPITAL_COMMUNITY): Payer: BC Managed Care – PPO

## 2022-10-22 ENCOUNTER — Encounter (HOSPITAL_COMMUNITY): Payer: Self-pay | Admitting: Registered Nurse

## 2022-10-22 ENCOUNTER — Ambulatory Visit (HOSPITAL_COMMUNITY)
Admission: RE | Admit: 2022-10-22 | Discharge: 2022-10-22 | Disposition: A | Discharge status: Still In Hospital | Payer: BC Managed Care – PPO | Attending: Internal Medicine | Admitting: Internal Medicine

## 2022-10-22 ENCOUNTER — Telehealth: Payer: Self-pay

## 2022-10-22 ENCOUNTER — Telehealth (HOSPITAL_COMMUNITY): Payer: Self-pay

## 2022-10-22 ENCOUNTER — Emergency Department (HOSPITAL_COMMUNITY)
Admission: EM | Admit: 2022-10-22 | Discharge: 2022-10-22 | Disposition: A | Discharge status: Home / Self Care | Payer: BC Managed Care – PPO | Source: Home / Self Care | Attending: Emergency Medicine | Admitting: Emergency Medicine

## 2022-10-22 ENCOUNTER — Encounter (HOSPITAL_COMMUNITY): Payer: Self-pay | Admitting: Internal Medicine

## 2022-10-22 DIAGNOSIS — I4891 Unspecified atrial fibrillation: Secondary | ICD-10-CM | POA: Insufficient documentation

## 2022-10-22 DIAGNOSIS — R195 Other fecal abnormalities: Secondary | ICD-10-CM | POA: Insufficient documentation

## 2022-10-22 DIAGNOSIS — E669 Obesity, unspecified: Secondary | ICD-10-CM | POA: Diagnosis not present

## 2022-10-22 DIAGNOSIS — Z5309 Procedure and treatment not carried out because of other contraindication: Secondary | ICD-10-CM | POA: Diagnosis not present

## 2022-10-22 DIAGNOSIS — Z6841 Body Mass Index (BMI) 40.0 and over, adult: Secondary | ICD-10-CM | POA: Diagnosis not present

## 2022-10-22 DIAGNOSIS — Z1211 Encounter for screening for malignant neoplasm of colon: Secondary | ICD-10-CM | POA: Diagnosis not present

## 2022-10-22 LAB — CBC WITH DIFFERENTIAL/PLATELET
Abs Immature Granulocytes: 0.04 10*3/uL (ref 0.00–0.07)
Basophils Absolute: 0 10*3/uL (ref 0.0–0.1)
Basophils Relative: 0 %
Eosinophils Absolute: 0.1 10*3/uL (ref 0.0–0.5)
Eosinophils Relative: 1 %
HCT: 52.1 % — ABNORMAL HIGH (ref 39.0–52.0)
Hemoglobin: 16.4 g/dL (ref 13.0–17.0)
Immature Granulocytes: 1 %
Lymphocytes Relative: 18 %
Lymphs Abs: 1.6 10*3/uL (ref 0.7–4.0)
MCH: 27.2 pg (ref 26.0–34.0)
MCHC: 31.5 g/dL (ref 30.0–36.0)
MCV: 86.5 fL (ref 80.0–100.0)
Monocytes Absolute: 0.8 10*3/uL (ref 0.1–1.0)
Monocytes Relative: 9 %
Neutro Abs: 6.3 10*3/uL (ref 1.7–7.7)
Neutrophils Relative %: 71 %
Platelets: 245 10*3/uL (ref 150–400)
RBC: 6.02 MIL/uL — ABNORMAL HIGH (ref 4.22–5.81)
RDW: 14.3 % (ref 11.5–15.5)
WBC: 8.8 10*3/uL (ref 4.0–10.5)
nRBC: 0 % (ref 0.0–0.2)

## 2022-10-22 LAB — COMPREHENSIVE METABOLIC PANEL
ALT: 23 U/L (ref 0–44)
AST: 22 U/L (ref 15–41)
Albumin: 3.5 g/dL (ref 3.5–5.0)
Alkaline Phosphatase: 79 U/L (ref 38–126)
Anion gap: 11 (ref 5–15)
BUN: 13 mg/dL (ref 6–20)
CO2: 23 mmol/L (ref 22–32)
Calcium: 8.9 mg/dL (ref 8.9–10.3)
Chloride: 103 mmol/L (ref 98–111)
Creatinine, Ser: 1.15 mg/dL (ref 0.61–1.24)
GFR, Estimated: >60 mL/min (ref >60)
Glucose, Bld: 95 mg/dL (ref 70–99)
Potassium: 4.5 mmol/L (ref 3.5–5.1)
Sodium: 137 mmol/L (ref 135–145)
Total Bilirubin: 0.9 mg/dL (ref 0.3–1.2)
Total Protein: 7.7 g/dL (ref 6.5–8.1)

## 2022-10-22 LAB — T4, FREE: Free T4: 0.91 ng/dL (ref 0.61–1.12)

## 2022-10-22 LAB — BRAIN NATRIURETIC PEPTIDE: B Natriuretic Peptide: 49.7 pg/mL (ref 0.0–100.0)

## 2022-10-22 LAB — TROPONIN I (HIGH SENSITIVITY): Troponin I (High Sensitivity): 3 ng/L (ref <18)

## 2022-10-22 LAB — TSH: TSH: 1.985 u[IU]/mL (ref 0.350–4.500)

## 2022-10-22 LAB — MAGNESIUM: Magnesium: 1.8 mg/dL (ref 1.7–2.4)

## 2022-10-22 SURGERY — CANCELLED PROCEDURE

## 2022-10-22 MED ORDER — METOPROLOL TARTRATE 25 MG PO TABS: 25.0000 mg | ORAL_TABLET | Freq: Two times a day (BID) | ORAL | 0 refills | Status: DC | PRN

## 2022-10-22 MED ORDER — PROPOFOL 1000 MG/100ML IV EMUL
INTRAVENOUS | Status: AC
  Filled 2022-10-22: qty 100

## 2022-10-22 MED ORDER — METOPROLOL TARTRATE 25 MG PO TABS
25.0000 mg | ORAL_TABLET | Freq: Once | ORAL | Status: AC
  Administered 2022-10-22: 25 mg via ORAL
  Filled 2022-10-22: qty 1

## 2022-10-22 SURGICAL SUPPLY — 21 items
ELECT REM PT RETURN 9FT ADLT (ELECTROSURGICAL)
ELECTRODE REM PT RTRN 9FT ADLT (ELECTROSURGICAL) IMPLANT
FCP BXJMBJMB 240X2.8X (CUTTING FORCEPS)
FLOOR PAD 36X40 (MISCELLANEOUS) ×2
FORCEPS BIOP RAD 4 LRG CAP 4 (CUTTING FORCEPS) IMPLANT
FORCEPS BIOP RJ4 240 W/NDL (CUTTING FORCEPS)
FORCEPS BXJMBJMB 240X2.8X (CUTTING FORCEPS) IMPLANT
INJECTOR/SNARE I SNARE (MISCELLANEOUS) IMPLANT
LUBRICANT JELLY 4.5OZ STERILE (MISCELLANEOUS) IMPLANT
MANIFOLD NEPTUNE II (INSTRUMENTS) IMPLANT
NEEDLE SCLEROTHERAPY 25GX240 (NEEDLE) IMPLANT
PAD FLOOR 36X40 (MISCELLANEOUS) ×2 IMPLANT
PROBE APC STR FIRE (PROBE) IMPLANT
PROBE INJECTION GOLD (MISCELLANEOUS)
PROBE INJECTION GOLD 7FR (MISCELLANEOUS) IMPLANT
SNARE ROTATE MED OVAL 20MM (MISCELLANEOUS) IMPLANT
SYR 50ML LL SCALE MARK (SYRINGE) IMPLANT
TRAP SPECIMEN MUCOUS 40CC (MISCELLANEOUS) IMPLANT
TUBING ENDO SMARTCAP PENTAX (MISCELLANEOUS) IMPLANT
TUBING IRRIGATION ENDOGATOR (MISCELLANEOUS) ×2 IMPLANT
WATER STERILE IRR 1000ML POUR (IV SOLUTION) IMPLANT

## 2022-10-22 NOTE — H&P (Signed)
GASTROENTEROLOGY PROCEDURE H&P NOTE   Primary Care Physician: Dorna Mai, MD    Reason for Procedure:  Positive Cologuard  Plan:    Colonoscopy   The nature of the procedure, as well as the risks, benefits, and alternatives were carefully and thoroughly reviewed with the patient. Ample time for discussion and questions allowed. The patient understood, was satisfied, and agreed to proceed.     HPI: Bruce Marshall is a 47 y.o. male who presents for colonoscopy evaluate positive Cologuard.  Medical history as below.  Tolerated the prep.  No recent chest pain or shortness of breath.  No abdominal pain today.  After placing patient on monitor he was noted to have a supraventricular tachycardia which on EKG proves to be atrial flutter.  He does report having several episodes at home of tachycardia but on previous evaluation they have never been able to capture any abnormalities.  Consult with anesthesia who recommends patient be evaluated in the emergency department for further treatment.  He will be sent to the ER and this test needs to be rescheduled.  History reviewed. No pertinent past medical history.  Past Surgical History:  Procedure Laterality Date   PILONIDAL CYST DRAINAGE     WRIST SURGERY Right     Prior to Admission medications   Medication Sig Start Date End Date Taking? Authorizing Provider  [employer] COVID-19 [place] HOME TEST KIT TEST AS DIRECTED TODAY 01/16/22   [provider]  Na Sulfate-K Sulfate-Mg Sulf (SUPREP BOWEL PREP KIT) 17.5-3.13-1.6 GM/177ML SOLN Take 1 kit by mouth as directed. For colonoscopy prep 09/18/22   Tameika Heckmann, Lajuan Lines, MD  naproxen (NAPROSYN) 500 MG tablet Take 1 tablet (500 mg total) by mouth 2 (two) times daily as needed. Patient not taking: Reported on 09/18/2022 02/04/22   Volney American, PA-C    No current facility-administered medications for this encounter.   Current Outpatient Medications  Medication Sig Dispense Refill    BINAXNOW COVID-19 [place] HOME TEST KIT TEST AS DIRECTED TODAY     Na Sulfate-K Sulfate-Mg Sulf (SUPREP BOWEL PREP KIT) 17.5-3.13-1.6 GM/177ML SOLN Take 1 kit by mouth as directed. For colonoscopy prep 354 mL 0   naproxen (NAPROSYN) 500 MG tablet Take 1 tablet (500 mg total) by mouth 2 (two) times daily as needed. (Patient not taking: Reported on 09/18/2022) 30 tablet 0    Allergies as of 09/18/2022   (No Known Allergies)    Family History  Problem Relation Age of Onset   Healthy Daughter    Heart attack Maternal Uncle    Cancer Neg Hx    Stroke Neg Hx    Colon cancer Neg Hx    Esophageal cancer Neg Hx     Social History   Socioeconomic History   Marital status: Married    Spouse name: Not on file   Number of children: Not on file   Years of education: Not on file   Highest education level: Not on file  Occupational History   Not on file  Tobacco Use   Smoking status: Never   Smokeless tobacco: Never  Vaping Use   Vaping Use: Never used  Substance and Sexual Activity   Alcohol use: Not Currently   Drug use: Not Currently   Sexual activity: Not on file  Other Topics Concern   Not on file  Social History Narrative   Not on file   Social Determinants of Health   Financial Resource Strain: Not on file  Food Insecurity: Not  on file  Transportation Needs: Not on file  Physical Activity: Not on file  Stress: Not on file  Social Connections: Not on file  Intimate Partner Violence: Not on file    Physical Exam: Vital signs in last 24 hours: @BP  (!) 136/93   Pulse (!) 146   Temp (!) 97.3 F (36.3 C) (Temporal)   Resp (!) 25   Ht 5\' 6"  (1.676 m)   Wt (!) 143.3 kg   SpO2 98%   BMI 51.00 kg/m  GEN: NAD EYE: Sclerae anicteric ENT: MMM CV: tachy, reg Pulm: CTA b/l GI: Soft, NT/ND NEURO:  Alert & Oriented x 3   Zenovia Jarred, MD [facility]  10/22/2022 9:15 AM

## 2022-10-22 NOTE — ED Notes (Signed)
ED Provider at bedside. 

## 2022-10-22 NOTE — ED Provider Notes (Signed)
[place] EMERGENCY DEPARTMENT AT [facility] Provider Note  CSN: [id number] Arrival date & time: 10/22/22 4098  Chief Complaint(s) Tachycardia  HPI Bruce Marshall is a 47 y.o. male with past medical history as below, significant for prediabetes, obesity who presents to the ED with complaint of tachycardia.  Patient was being prepped for the noscapine found to have elevated heart rate 130s 140s.  Irregular.  No palpitations or chest pain.  No nausea or vomiting.  He has been prepping for colonoscopy over the past 24 hours.  Reports intermittent palpitations, rapid heart rate in the evening time prior to bed over the past year intermittently.  Typically does resolve spontaneously.  No significant cardiac abnormalities he is aware of.  Not take blood thinners or aspirin daily.  Not see cardiologist.  Past Medical History History reviewed. No pertinent past medical history. Patient Active Problem List   Diagnosis Date Noted   Erythema of lower extremity 12/16/2018   Lower extremity edema 11/29/2018   Redness and swelling of lower leg 11/29/2018   Prediabetes 11/29/2018   Home Medication(s) Prior to Admission medications   Medication Sig Start Date End Date Taking? Authorizing Provider  metoprolol tartrate (LOPRESSOR) 25 MG tablet Take 1 tablet (25 mg total) by mouth 2 (two) times daily as needed (rapid heart rate). 10/22/22  Yes Sloan Leiter, DO  BINAXNOW COVID-19 AG HOME TEST KIT TEST AS DIRECTED TODAY 01/16/22   [provider]  Na Sulfate-K Sulfate-Mg Sulf (SUPREP BOWEL PREP KIT) 17.5-3.13-1.6 GM/177ML SOLN Take 1 kit by mouth as directed. For colonoscopy prep 09/18/22   [name], Carie Caddy, MD  naproxen (NAPROSYN) 500 MG tablet Take 1 tablet (500 mg total) by mouth 2 (two) times daily as needed. Patient not taking: Reported on 09/18/2022 02/04/22   Particia Nearing, PA-C                                                                                                                                     Past Surgical History Past Surgical History:  Procedure Laterality Date   PILONIDAL CYST DRAINAGE     WRIST SURGERY Right    Family History Family History  Problem Relation Age of Onset   Healthy Daughter    Heart attack Maternal Uncle    Cancer Neg Hx    Stroke Neg Hx    Colon cancer Neg Hx    Esophageal cancer Neg Hx     Social History Social History   Tobacco Use   Smoking status: Never   Smokeless tobacco: Never  Vaping Use   Vaping Use: Never used  Substance Use Topics   Alcohol use: Not Currently   Drug use: Not Currently   Allergies Patient has no known allergies.  Review of Systems Review of Systems  Constitutional:  Negative for chills and fever.  HENT:  Negative for facial swelling and trouble swallowing.   Eyes:  Negative for photophobia and visual disturbance.  Respiratory:  Negative for cough and shortness of breath.   Cardiovascular:  Positive for palpitations. Negative for chest pain.  Gastrointestinal:  Negative for abdominal pain, nausea and vomiting.  Endocrine: Negative for polydipsia and polyuria.  Genitourinary:  Negative for difficulty urinating and hematuria.  Musculoskeletal:  Negative for gait problem and joint swelling.  Skin:  Negative for pallor and rash.  Neurological:  Negative for syncope and headaches.  Psychiatric/Behavioral:  Negative for agitation and confusion.     Physical Exam Vital Signs  I have reviewed the triage vital signs BP 106/84   Pulse 88   Temp 98 F (36.7 C) (Oral)   Resp 20   SpO2 93%  Physical Exam Vitals and nursing note reviewed.  Constitutional:      General: He is not in acute distress.    Appearance: He is well-developed. He is obese. He is ill-appearing.  HENT:     Head: Normocephalic and atraumatic.     Right Ear: External ear normal.     Left Ear: External ear normal.     Mouth/Throat:     Mouth: Mucous membranes are moist.  Eyes:     General: No scleral  icterus. Cardiovascular:     Rate and Rhythm: Tachycardia present. Rhythm irregular.     Pulses: Normal pulses.     Heart sounds: Normal heart sounds.  Pulmonary:     Effort: Pulmonary effort is normal. No respiratory distress.     Breath sounds: Normal breath sounds.  Abdominal:     General: Abdomen is flat.     Palpations: Abdomen is soft.     Tenderness: There is no abdominal tenderness.  Musculoskeletal:     Cervical back: No rigidity.     Right lower leg: No edema.     Left lower leg: No edema.  Skin:    General: Skin is warm and dry.     Capillary Refill: Capillary refill takes less than 2 seconds.  Neurological:     Mental Status: He is alert and oriented to person, place, and time.     GCS: GCS eye subscore is 4. GCS verbal subscore is 5. GCS motor subscore is 6.  Psychiatric:        Mood and Affect: Mood normal.        Behavior: Behavior normal.     ED Results and Treatments Labs (all labs ordered are listed, but only abnormal results are displayed) Labs Reviewed  CBC WITH DIFFERENTIAL/PLATELET - Abnormal; Notable for the following components:      Result Value   RBC 6.02 (*)    HCT 52.1 (*)    All other components within normal limits  COMPREHENSIVE METABOLIC PANEL  TSH  T4, FREE  MAGNESIUM  BRAIN NATRIURETIC PEPTIDE  TROPONIN I (HIGH SENSITIVITY)  TROPONIN I (HIGH SENSITIVITY)  Radiology DG Chest Port 1 View  Result Date: 10/22/2022 CLINICAL DATA:  Atrial fibrillation with rapid ventricular response. EXAM: PORTABLE CHEST 1 VIEW COMPARISON:  October 26, 2018 FINDINGS: The heart size and mediastinal contours are within normal limits. Both lungs are clear. The visualized skeletal structures are unremarkable. IMPRESSION: No active disease. Electronically Signed   By: Dorise Bullion III M.D.   On: 10/22/2022 10:13    Pertinent labs & imaging  results that were available during my care of the patient were reviewed by me and considered in my medical decision making (see MDM for details).  Medications Ordered in ED Medications  metoprolol tartrate (LOPRESSOR) tablet 25 mg (25 mg Oral Given 10/22/22 1218)                                                                                                                                     Procedures Procedures  (including critical care time)  Medical Decision Making / ED Course    Medical Decision Making:    Bruce Marshall is a 47 y.o. male  with past medical history as below, significant for prediabetes, obesity who presents to the ED with complaint of tachycardia. . The complaint involves an extensive differential diagnosis and also carries with it a high risk of complications and morbidity.  Serious etiology was considered. Ddx includes but is not limited to: Dysrhythmia, tachy dysrhythmia, A-fib, electrolyte derangement, endocrine derangement, ACS, etc.  Complete initial physical exam performed, notably the patient  was resting comfortably, telemetry reveals A-fib with RVR.  HDS.  Blood pressure stable.    Reviewed and confirmed nursing documentation for past medical history, family history, social history.  Vital signs reviewed.    Clinical Course as of 10/22/22 1327  Tue Oct 22, 2022  1304 Rpt EKG shows sinus rhythm [SG]  1322 CHA2DS2-VASc is 0 [SG]    Clinical Course User Index [SG] Jeanell Sparrow, DO    Patient to the ED from Endo suite secondary to abnormal heart rate.  Found him in A-fib with RVR.  Patient reports over the past year he has had paroxysms of palpitations but was never diagnosed as the symptoms resolved prior to him being seen by his PCP.  He has no chest pain at this time.  No dyspnea.  No nausea or vomiting.  No lightheadedness or syncope/near syncope.  Labs reviewed and are stable.  He has converted to sinus rhythm after beta-blocker IV/p.o.  Initial  troponin is negative, reports he has to leave and cannot stay for delta.  He has no chest pain.  Lytes stable.  CHA2DS2-VASc score is 0, new onset A-fib, resolved after beta-blocker.  Give as needed metoprolol for home.  Follow-up with A-fib clinic. RTED if worse.   The patient improved significantly and was discharged in stable condition. Detailed discussions were had with the patient regarding current findings, and need for close f/u with PCP or  on call doctor. The patient has been instructed to return immediately if the symptoms worsen in any way for re-evaluation. Patient verbalized understanding and is in agreement with current care plan. All questions answered prior to discharge.     Additional history obtained: -Additional history obtained from family -External records from outside source obtained and reviewed including: Chart review including previous notes, labs, imaging, consultation notes including primary care documentation, prior labs and imaging, home medications   Lab Tests: -I ordered, reviewed, and interpreted labs.   The pertinent results include:   Labs Reviewed  CBC WITH DIFFERENTIAL/PLATELET - Abnormal; Notable for the following components:      Result Value   RBC 6.02 (*)    HCT 52.1 (*)    All other components within normal limits  COMPREHENSIVE METABOLIC PANEL  TSH  T4, FREE  MAGNESIUM  BRAIN NATRIURETIC PEPTIDE  TROPONIN I (HIGH SENSITIVITY)  TROPONIN I (HIGH SENSITIVITY)    Notable for labs stabel  EKG   EKG Interpretation  Date/Time:  Tuesday October 22 2022 11:20:20 EST Ventricular Rate:  88 PR Interval:  160 QRS Duration: 84 QT Interval:  365 QTC Calculation: 442 R Axis:   48 Text Interpretation: Sinus rhythm Multiform ventricular premature complexes Probable left atrial enlargement Low voltage, precordial leads Abnormal R-wave progression, early transition Confirmed by Wynona Dove ([id number]) on 10/22/2022 1:25:21 PM         Imaging Studies  ordered: I ordered imaging studies including cxr I independently visualized the following imaging with scope of interpretation limited to determining acute life threatening conditions related to emergency care: cxr, which revealed no acute process I independently visualized and interpreted imaging. I agree with the radiologist interpretation   Medicines ordered and prescription drug management: Meds ordered this encounter  Medications   metoprolol tartrate (LOPRESSOR) tablet 25 mg   metoprolol tartrate (LOPRESSOR) 25 MG tablet    Sig: Take 1 tablet (25 mg total) by mouth 2 (two) times daily as needed (rapid heart rate).    Dispense:  20 tablet    Refill:  0    -I have reviewed the patients home medicines and have made adjustments as needed   Consultations Obtained: na   Cardiac Monitoring: The patient was maintained on a cardiac monitor.  I personally viewed and interpreted the cardiac monitored which showed an underlying rhythm of: afib rvr > nsr  Social Determinants of Health:  Diagnosis or treatment significantly limited by social determinants of health: obesity   Reevaluation: After the interventions noted above, I reevaluated the patient and found that they have resolved  Co morbidities that complicate the patient evaluation History reviewed. No pertinent past medical history.    Dispostion: Disposition decision including need for hospitalization was considered, and patient discharged from emergency department.    Final Clinical Impression(s) / ED Diagnoses Final diagnoses:  Atrial fibrillation with RVR ([place])     This chart was dictated using voice recognition software.  Despite best efforts to proofread,  errors can occur which can change the documentation meaning.    Wynona Dove A, DO 10/22/22 1327

## 2022-10-22 NOTE — Telephone Encounter (Signed)
Pt scheduled for colon at [facility] 11/05/22 at 8:30am. [phone].

## 2022-10-22 NOTE — ED Triage Notes (Signed)
Was about to get a colonoscopy but staff noted tachycardia at 140s. Pt nonsymptomatic denies any cp/sob.

## 2022-10-22 NOTE — Discharge Instructions (Addendum)
It was a pleasure caring for you today in the emergency department.  Please return to the emergency department for any worsening or worrisome symptoms.  Please follow-up with Atrial fibrillation clinic.  Please take beta-blocker as needed

## 2022-10-22 NOTE — Telephone Encounter (Signed)
Referral in epic for pt to be seen at cards for a fib.

## 2022-10-22 NOTE — Telephone Encounter (Signed)
Reached out to patient to schedule ED f/u appointment. No answer left voicemail. 

## 2022-10-22 NOTE — Telephone Encounter (Signed)
-----   Message from Jerene Bears, MD sent at 10/22/2022  9:40 AM EST ----- Let's grab one and then make sure he has been seen by [facility] by then JMP  ----- Message ----- From: Algernon Huxley, RN Sent: 10/22/2022   9:35 AM EST To: Jerene Bears, MD  You have some slots on 11/05/22  ----- Message ----- From: Jerene Bears, MD Sent: 10/22/2022   9:17 AM EST To: Algernon Huxley, RN  Patient came for colonoscopy but was in atrial flutter He was sent to the ER  We need to get hospital colonoscopy rescheduled for him for positive Cologuard Hopefully the weight will not be too long When is my next availability?  Otherwise may need to look for somebody else to do it  JMP

## 2022-10-22 NOTE — Telephone Encounter (Signed)
Do I need to make a referral to cardiology?

## 2022-10-22 NOTE — Telephone Encounter (Signed)
Yes please, afib clinic consult Can you follow this to ensure done by next colon date? Thanks

## 2022-10-22 NOTE — ED Notes (Signed)
Blood work collected and sent to lab.

## 2022-10-22 NOTE — Progress Notes (Signed)
Pt presents with rapid heart rate, no chest pain or shortness of breath. Dr. Hilarie Fredrickson on unit and notified, Dr. Sabra Heck anesthesiologist consults. 12 lead EKG obtained, appears SVT on monitor once rate slows appears AFLUTTER. 12 leak confirms Aflutter. Dr. Sabra Heck at bedside discussing cancelling colonoscopy and would like patient to be seen in ED to consult cardiology [facility]

## 2022-10-22 NOTE — ED Notes (Signed)
Pt on cm sp02 and nibp call bell in reach

## 2022-10-22 NOTE — ED Notes (Signed)
Rpt EKG given to md gray

## 2022-10-24 ENCOUNTER — Telehealth: Payer: Self-pay

## 2022-10-24 NOTE — Telephone Encounter (Signed)
Note sent to [facility] PA regarding Dr. Vena Rua comments below.

## 2022-10-24 NOTE — Telephone Encounter (Signed)
Thanks So he should be ok to keep colon as schedule  If anticoagulation is started he must let us know prior to procedure as it would need to be held. He prob should tell cards about the colon appt and perhaps DOAC could be started after colonoscopy for + Cologuard

## 2022-10-24 NOTE — Telephone Encounter (Signed)
Pt is scheduled to see you 10/28/22 at 3pm. Pt is scheduled for a colonoscopy with Dr. Hilarie Fredrickson on 11/05/22 at [place] had a positive cologuard. He was supposed to have colonoscopy and was found to be in A fib the day of the procedure and sent to the ER. Please see the note below from Dr. Hilarie Fredrickson regarding upcoming colonoscopy and anticoagulant:   [name], Lajuan Lines, MD  You2 minutes ago (10:56 AM)    Thanks So he should be ok to keep colon as schedule  If anticoagulation is started he must let us know prior to procedure as it would need to be held. He prob should tell cards about the colon appt and perhaps DOAC could be started after colonoscopy for + Cologuard

## 2022-10-24 NOTE — Telephone Encounter (Signed)
Pt scheduled for A fib clinic on 2/12.

## 2022-10-24 NOTE — Progress Notes (Deleted)
  [place] [address] [place] Phone: [phone] Subjective:    I'm seeing this patient by the request  of:  Dorna Mai, MD  CC:   [id number]  Bruce Marshall is a 47 y.o. male coming in with complaint of L hip and groin pain. Patient states       No past medical history on file. Past Surgical History:  Procedure Laterality Date   PILONIDAL CYST DRAINAGE     WRIST SURGERY Right    Social History   Socioeconomic History   Marital status: Married    Spouse name: Not on file   Number of children: Not on file   Years of education: Not on file   Highest education level: Not on file  Occupational History   Not on file  Tobacco Use   Smoking status: Never   Smokeless tobacco: Never  Vaping Use   Vaping Use: Never used  Substance and Sexual Activity   Alcohol use: Not Currently   Drug use: Not Currently   Sexual activity: Not on file  Other Topics Concern   Not on file  Social History Narrative   Not on file   Social Determinants of Health   Financial Resource Strain: Not on file  Food Insecurity: Not on file  Transportation Needs: Not on file  Physical Activity: Not on file  Stress: Not on file  Social Connections: Not on file   No Known Allergies Family History  Problem Relation Age of Onset   Healthy Daughter    Heart attack Maternal Uncle    Cancer Neg Hx    Stroke Neg Hx    Colon cancer Neg Hx    Esophageal cancer Neg Hx      Current Outpatient Medications (Cardiovascular):    metoprolol tartrate (LOPRESSOR) 25 MG tablet, Take 1 tablet (25 mg total) by mouth 2 (two) times daily as needed (rapid heart rate).   Current Outpatient Medications (Analgesics):    naproxen (NAPROSYN) 500 MG tablet, Take 1 tablet (500 mg total) by mouth 2 (two) times daily as needed. (Patient not taking: Reported on 09/18/2022)   Current Outpatient Medications (Other):    BINAXNOW COVID-19 AG HOME TEST KIT, TEST  AS DIRECTED TODAY   Na Sulfate-K Sulfate-Mg Sulf (SUPREP BOWEL PREP KIT) 17.5-3.13-1.6 GM/177ML SOLN, Take 1 kit by mouth as directed. For colonoscopy prep   Reviewed prior external information including notes and imaging from  primary care provider As well as notes that were available from care everywhere and other healthcare systems.  Past medical history, social, surgical and family history all reviewed in electronic medical record.  No pertanent information unless stated regarding to the chief complaint.   Review of Systems:  No headache, visual changes, nausea, vomiting, diarrhea, constipation, dizziness, abdominal pain, skin rash, fevers, chills, night sweats, weight loss, swollen lymph nodes, body aches, joint swelling, chest pain, shortness of breath, mood changes. POSITIVE muscle aches  Objective  There were no vitals taken for this visit.   General: No apparent distress alert and oriented x3 mood and affect normal, dressed appropriately.  HEENT: Pupils equal, extraocular movements intact  Respiratory: Patient's speak in full sentences and does not appear short of breath  Cardiovascular: No lower extremity edema, non tender, no erythema      Impression and Recommendations:

## 2022-10-25 ENCOUNTER — Encounter (HOSPITAL_COMMUNITY): Payer: Self-pay | Admitting: Internal Medicine

## 2022-10-28 ENCOUNTER — Ambulatory Visit: Payer: BC Managed Care – PPO | Admitting: Family Medicine

## 2022-10-28 ENCOUNTER — Ambulatory Visit (HOSPITAL_COMMUNITY): Payer: BC Managed Care – PPO | Admitting: Physician Assistant

## 2022-10-29 ENCOUNTER — Other Ambulatory Visit: Payer: Self-pay

## 2022-10-29 MED ORDER — PEG 3350-KCL-NABCB-NACL-NASULF 236 G PO SOLR: ORAL | 0 refills | Status: DC

## 2022-10-29 NOTE — Telephone Encounter (Signed)
yes

## 2022-10-29 NOTE — Telephone Encounter (Signed)
Pt rescheduled the cardiology appt that was scheduled for today to 2/21. His procedure was scheduled for 11/05/22. Preadmission nurse contacted the office after speaking with pt. Do you want him now rescheduled after the cardiology appt on 2/21?

## 2022-10-29 NOTE — Telephone Encounter (Signed)
Yes, he needs to see cardiology first because he recently presented for colonoscopy where he was found to be in atrial fibrillation and the procedure was canceled Should he present again in atrial fibrillation the colonoscopy would not be performed This condition needs to be addressed first Colonoscopy remain strongly recommended due to positive Cologuard

## 2022-10-29 NOTE — Telephone Encounter (Signed)
Pts colon moved to 4/4 at [facility] at 8:45am. Pt to arrive there at 7:15am. Pt has suprep for previous prep and wanted to know if he needed a different prep. He read where the potassium in the prep could cause irregular heart rhythms. Please advise.

## 2022-10-29 NOTE — Telephone Encounter (Signed)
Can try golytely

## 2022-10-29 NOTE — Telephone Encounter (Signed)
Next available slot is 4/4, ok to move him to that date?

## 2022-10-29 NOTE — Telephone Encounter (Signed)
Left detailed message for pt that new prep has been sent to his pharmacy and he needs to pick it up so it does not get re shelved. Updated instructions mailed to pt.

## 2022-11-06 ENCOUNTER — Encounter (HOSPITAL_COMMUNITY): Payer: Self-pay | Admitting: Physician Assistant

## 2022-11-06 ENCOUNTER — Inpatient Hospital Stay (HOSPITAL_COMMUNITY)
Admission: RE | Admit: 2022-11-06 | Discharge: 2022-11-06 | Disposition: A | Discharge status: Home / Self Care | Payer: BC Managed Care – PPO | Source: Ambulatory Visit | Attending: Physician Assistant | Admitting: Physician Assistant

## 2022-11-06 ENCOUNTER — Ambulatory Visit (HOSPITAL_COMMUNITY)
Admission: RE | Admit: 2022-11-06 | Discharge: 2022-11-06 | Disposition: A | Discharge status: Home / Self Care | Payer: BC Managed Care – PPO | Source: Ambulatory Visit | Attending: Physician Assistant | Admitting: Physician Assistant

## 2022-11-06 VITALS — BP 134/84 | HR 100 | Ht 66.0 in | Wt 322.4 lb

## 2022-11-06 DIAGNOSIS — I480 Paroxysmal atrial fibrillation: Secondary | ICD-10-CM

## 2022-11-06 DIAGNOSIS — Z7182 Exercise counseling: Secondary | ICD-10-CM | POA: Insufficient documentation

## 2022-11-06 DIAGNOSIS — E669 Obesity, unspecified: Secondary | ICD-10-CM | POA: Insufficient documentation

## 2022-11-06 DIAGNOSIS — Z6841 Body Mass Index (BMI) 40.0 and over, adult: Secondary | ICD-10-CM | POA: Insufficient documentation

## 2022-11-06 NOTE — Progress Notes (Signed)
Primary Care Physician: Dorna Mai, MD Primary Cardiologist: none Primary Electrophysiologist: none Referring Physician: [facility]   Bruce Marshall is a 47 y.o. male with a history of atrial fibrillation who presents for consultation in the [facility].  The patient was initially diagnosed with atrial fibrillation 10/22/22 after for a colonoscopy with Dr Hilarie Fredrickson for a positive Cologuard. He was beign prepped when his heart rate was noted to be 130s-140s bpm. An ECG was done which showed afib with RVR (ECG not available for review). The colonoscopy was cancelled and he was sent to the ED for evaluation. He was given IV metoprolol and converted to SR. Patient was not started on anticoagulation for a CHADS2VASC score of 0. He was discharged with PRN metoprolol. Today, patient reports that for the past 6 months he has had "fluttering" in his chest at night. He did not seek evaluation because it always resolved quickly. He has also noticed occasional heart rates in the 150s bpm on his smart watch. He does admit to snoring and witnessed apnea.   Today, he denies symptoms of chest pain, shortness of breath, orthopnea, PND, lower extremity edema, dizziness, presyncope, syncope, daytime somnolence, bleeding, or neurologic sequela. The patient is tolerating medications without difficulties and is otherwise without complaint today.    Atrial Fibrillation Risk Factors:  he does have symptoms or diagnosis of sleep apnea. he is agreeable to sleep study.  he does not have a history of rheumatic fever. The patient does not have a history of early familial atrial fibrillation or other arrhythmias.  he has a BMI of Body mass index is 52.04 kg/m.Marland Kitchen Filed Weights   11/06/22 1527  Weight: (!) 146.2 kg    Family History  Problem Relation Age of Onset   Healthy Daughter    Heart attack Maternal Uncle    Cancer Neg Hx    Stroke Neg Hx    Colon cancer Neg Hx    Esophageal  cancer Neg Hx      Atrial Fibrillation Management history:  Previous antiarrhythmic drugs: none Previous cardioversions: none Previous ablations: none CHADS2VASC score: 0 Anticoagulation history: none   No past medical history on file. Past Surgical History:  Procedure Laterality Date   PILONIDAL CYST DRAINAGE     WRIST SURGERY Right     Current Outpatient Medications  Medication Sig Dispense Refill   metoprolol tartrate (LOPRESSOR) 25 MG tablet Take 1 tablet (25 mg total) by mouth 2 (two) times daily as needed (rapid heart rate). 20 tablet 0   No current facility-administered medications for this encounter.    No Known Allergies  Social History   Socioeconomic History   Marital status: Married    Spouse name: Not on file   Number of children: Not on file   Years of education: Not on file   Highest education level: Not on file  Occupational History   Not on file  Tobacco Use   Smoking status: Never   Smokeless tobacco: Never   Tobacco comments:    Never smoke 11/06/22  Vaping Use   Vaping Use: Never used  Substance and Sexual Activity   Alcohol use: Not Currently   Drug use: Not Currently   Sexual activity: Not on file  Other Topics Concern   Not on file  Social History Narrative   Not on file   Social Determinants of Health   Financial Resource Strain: Not on file  Food Insecurity: Not on file  Transportation  Needs: Not on file  Physical Activity: Not on file  Stress: Not on file  Social Connections: Not on file  Intimate Partner Violence: Not on file     ROS- All systems are reviewed and negative except as per the HPI above.  Physical Exam: Vitals:   11/06/22 1527  BP: 134/84  Pulse: 100  Weight: (!) 146.2 kg  Height: 5' 6"$  (1.676 m)    GEN- The patient is a well appearing obese male, alert and oriented x 3 today.   Head- normocephalic, atraumatic Eyes-  Sclera clear, conjunctiva pink Ears- hearing intact Oropharynx- clear Neck-  supple  Lungs- Clear to ausculation bilaterally, normal work of breathing Heart- Regular rate and rhythm, no murmurs, rubs or gallops  GI- soft, NT, ND, + BS Extremities- no clubbing, cyanosis, trace bilateral edema.  MS- no significant deformity or atrophy Skin- no rash or lesion Psych- euthymic mood, full affect Neuro- strength and sensation are intact  Wt Readings from Last 3 Encounters:  11/06/22 (!) 146.2 kg  10/22/22 (!) 143.3 kg  09/18/22 (!) 144.9 kg    EKG today demonstrates  SR Vent. rate 100 BPM PR interval 150 ms QRS duration 76 ms QT/QTcB 352/454 ms  Epic records are reviewed at length today  CHA2DS2-VASc Score = 0  The patient's score is based upon: CHF History: 0 HTN History: 0 Diabetes History: 0 Stroke History: 0 Vascular Disease History: 0 Age Score: 0 Gender Score: 0       ASSESSMENT AND PLAN: 1. Paroxysmal Atrial Fibrillation (ICD10:  I48.0) The patient's CHA2DS2-VASc score is 0, indicating a 0.2% annual risk of stroke.   General education about afib provided and questions answered. We also discussed his stroke risk and the risks and benefits of anticoagulation. Check echocardiogram Continue Lopressor 25 mg BID PRN for heart racing. Patient prefers not to start scheduled medication at this time.  Will order 14 day Zio monitor to evaluate arrhythmia burden.  Would not recommend starting anticoagulation at this time with CV score of 0.   2. Obesity Body mass index is 52.04 kg/m. Lifestyle modification was discussed at length including regular exercise and weight reduction.  3. Snoring/witnessed apnea The importance of adequate treatment of sleep apnea was discussed today in order to improve our ability to maintain sinus rhythm long term. Will refer for sleep study.    Follow up in the AF clinic in one month. Will also refer to have him establish care with a primary cardiologist.     Adline Peals PA-C [facility] [address] [place], [place] [address] [phone] 11/06/2022 3:45 PM

## 2022-11-07 ENCOUNTER — Telehealth: Payer: Self-pay

## 2022-11-07 NOTE — Progress Notes (Signed)
Pt can be rescheduled at hospital for colonoscopy He is not on [place]

## 2022-11-07 NOTE — Telephone Encounter (Signed)
[name], Bruce Lines, Bruce Marshall Physician Signed Yesterday     Pt can be rescheduled at hospital for colonoscopy He is not on DOAC JMP     Pt is currently scheduled for the procedure at [facility] 12/19/22.

## 2022-11-29 ENCOUNTER — Ambulatory Visit (HOSPITAL_BASED_OUTPATIENT_CLINIC_OR_DEPARTMENT_OTHER): Payer: BC Managed Care – PPO | Admitting: Cardiovascular Disease

## 2022-11-29 DIAGNOSIS — I480 Paroxysmal atrial fibrillation: Secondary | ICD-10-CM

## 2022-12-02 ENCOUNTER — Ambulatory Visit (HOSPITAL_COMMUNITY)
Admission: RE | Admit: 2022-12-02 | Discharge: 2022-12-02 | Disposition: A | Discharge status: Home / Self Care | Payer: BC Managed Care – PPO | Source: Ambulatory Visit | Attending: Physician Assistant | Admitting: Physician Assistant

## 2022-12-02 DIAGNOSIS — I4891 Unspecified atrial fibrillation: Secondary | ICD-10-CM | POA: Insufficient documentation

## 2022-12-02 DIAGNOSIS — I480 Paroxysmal atrial fibrillation: Secondary | ICD-10-CM

## 2022-12-02 DIAGNOSIS — I517 Cardiomegaly: Secondary | ICD-10-CM | POA: Insufficient documentation

## 2022-12-02 LAB — ECHOCARDIOGRAM COMPLETE
Calc EF: 56.5 %
S' Lateral: 2.9 cm
Single Plane A2C EF: 56.8 %
Single Plane A4C EF: 53.6 %

## 2022-12-02 NOTE — Progress Notes (Signed)
Echocardiogram 2D Echocardiogram has been performed.  Bruce Marshall 12/02/2022, 3:46 PM

## 2022-12-06 ENCOUNTER — Ambulatory Visit (HOSPITAL_BASED_OUTPATIENT_CLINIC_OR_DEPARTMENT_OTHER): Payer: BC Managed Care – PPO | Attending: Physician Assistant | Admitting: Cardiovascular Disease

## 2022-12-06 DIAGNOSIS — I480 Paroxysmal atrial fibrillation: Secondary | ICD-10-CM | POA: Diagnosis not present

## 2022-12-06 DIAGNOSIS — G4733 Obstructive sleep apnea (adult) (pediatric): Secondary | ICD-10-CM | POA: Diagnosis not present

## 2022-12-09 ENCOUNTER — Encounter: Payer: Self-pay | Admitting: Internal Medicine

## 2022-12-09 ENCOUNTER — Ambulatory Visit (HOSPITAL_COMMUNITY): Payer: BC Managed Care – PPO | Admitting: Physician Assistant

## 2022-12-09 ENCOUNTER — Ambulatory Visit: Payer: BC Managed Care – PPO | Attending: Cardiovascular Disease | Admitting: Internal Medicine

## 2022-12-09 ENCOUNTER — Ambulatory Visit: Payer: BC Managed Care – PPO | Admitting: Cardiovascular Disease

## 2022-12-09 VITALS — BP 110/70 | HR 92 | Ht 66.5 in | Wt 323.2 lb

## 2022-12-09 DIAGNOSIS — I480 Paroxysmal atrial fibrillation: Secondary | ICD-10-CM | POA: Insufficient documentation

## 2022-12-09 DIAGNOSIS — R600 Localized edema: Secondary | ICD-10-CM | POA: Diagnosis not present

## 2022-12-09 MED ORDER — FUROSEMIDE 20 MG PO TABS: 20.0000 mg | ORAL_TABLET | Freq: Every day | ORAL | 11 refills | Status: AC | PRN

## 2022-12-09 MED ORDER — METOPROLOL TARTRATE 25 MG PO TABS: 25.0000 mg | ORAL_TABLET | Freq: Two times a day (BID) | ORAL | 3 refills | Status: DC

## 2022-12-09 NOTE — Progress Notes (Signed)
Cardiology Office Note:    Date:  12/09/2022   ID:  Bruce Marshall, DOB 1976-01-17, MRN [id number]  PCP:  Dorna Mai, MD   [place] Providers Cardiologist:  None     Referring MD: Jerene Bears, MD   CC: New AF Consulted for the evaluation of atrial fibrillation at the behest of Dr. Tonye Royalty  History of Present Illness:    Bruce Marshall is a 47 y.o. male with a hx of new AF.  Found to have AF 10/2021.  CHADSVASC 0.  Has STOPBANG of 5 when seeing AF clinic 11/06/2022 and has slee study pending.  Has super morbid obesity.  Patient notes that he is feeling well.   He has never really had palpitations; his smart watch and his pulse will help notify him that he is in PAF.  He attributes the first version of this related to the prep for his colonscopy.   Has had no chest pain, chest pressure, chest tightness, chest stinging .  No shortness of breath, DOE .  No PND or orthopnea.  No weight gain, or abdominal swelling.  No syncope or near syncope . Notes  no palpitations or funny heart beats.     He notes that all of his testing has stemmed from left leg LE edema.  He did not want to take the lasix medication he was prescribed until he had a better sense of why he had LE edema.   Past Medical History:  Diagnosis Date   A-fib ([place])    Chest pain    Groin pain     Past Surgical History:  Procedure Laterality Date   PILONIDAL CYST DRAINAGE     WRIST SURGERY Right     Current Medications: Current Meds  Medication Sig   albuterol (VENTOLIN HFA) 108 (90 Base) MCG/ACT inhaler Inhale into the lungs as needed for wheezing or shortness of breath.   furosemide (LASIX) 20 MG tablet Take 1 tablet (20 mg total) by mouth daily as needed (leg swelling).   metoprolol tartrate (LOPRESSOR) 25 MG tablet Take 1 tablet (25 mg total) by mouth 2 (two) times daily.   [DISCONTINUED] metoprolol tartrate (LOPRESSOR) 25 MG tablet Take 1 tablet (25 mg total) by mouth 2 (two) times daily as  needed (rapid heart rate).     Allergies:   Patient has no known allergies.   Social History   Socioeconomic History   Marital status: Married    Spouse name: Not on file   Number of children: Not on file   Years of education: Not on file   Highest education level: Not on file  Occupational History   Not on file  Tobacco Use   Smoking status: Never   Smokeless tobacco: Never   Tobacco comments:    Never smoke 11/06/22  Vaping Use   Vaping Use: Never used  Substance and Sexual Activity   Alcohol use: Not Currently   Drug use: Not Currently   Sexual activity: Not on file  Other Topics Concern   Not on file  Social History Narrative   Not on file   Social Determinants of Health   Financial Resource Strain: Not on file  Food Insecurity: Not on file  Transportation Needs: Not on file  Physical Activity: Not on file  Stress: Not on file  Social Connections: Not on file     Family History: The patient's family history includes Healthy in his daughter; Heart attack in his maternal uncle. There is no  history of Cancer, Stroke, Colon cancer, or Esophageal cancer.  ROS:   Please see the history of present illness.     All other systems reviewed and are negative.  EKGs/Labs/Other Studies Reviewed:    The following studies were reviewed today:  EKG:  EKG is  ordered today.  The ekg ordered today demonstrates  12/09/22: SR rate 92   Cardiac Studies & Procedures       ECHOCARDIOGRAM  ECHOCARDIOGRAM COMPLETE 12/02/2022  Narrative ECHOCARDIOGRAM REPORT    Patient Name:   [facility] Date of Exam: 12/02/2022 Medical Rec #:  [id number]      Height:       66.0 in Accession #:    [id number]     Weight:       322.4 lb Date of Birth:  02-20-1976      BSA:          2.450 m Patient Age:    70 years       BP:           134/84 mmHg Patient Gender: M              HR:           87 bpm. Exam Location:  Outpatient  Procedure: 2D Echo, Cardiac Doppler and Color  Doppler  Indications:    I48.91* Unspeicified atrial fibrillation  History:        Patient has no prior history of Echocardiogram examinations. Arrythmias:Atrial Fibrillation; Signs/Symptoms:Chest Pain.  Sonographer:    Bernadene Person RDCS Referring Phys: [id number] [place]   1. Left ventricular ejection fraction, by estimation, is 55 to 60%. The left ventricle has normal function. The left ventricle has no regional wall motion abnormalities. There is mild concentric left ventricular hypertrophy. Left ventricular diastolic parameters are indeterminate. 2. Right ventricular systolic function is normal. The right ventricular size is normal. 3. The mitral valve is normal in structure. No evidence of mitral valve regurgitation. No evidence of mitral stenosis. 4. The aortic valve was not well visualized. Aortic valve regurgitation is not visualized.  Comparison(s): No prior Echocardiogram.  FINDINGS Left Ventricle: Left ventricular ejection fraction, by estimation, is 55 to 60%. The left ventricle has normal function. The left ventricle has no regional wall motion abnormalities. The left ventricular internal cavity size was normal in size. There is mild concentric left ventricular hypertrophy. Left ventricular diastolic parameters are indeterminate.  Right Ventricle: The right ventricular size is normal. Right vetricular wall thickness was not assessed. Right ventricular systolic function is normal.  Left Atrium: Left atrial size was normal in size.  Right Atrium: Right atrial size was normal in size.  Pericardium: Trivial pericardial effusion is present.  Mitral Valve: The mitral valve is normal in structure. No evidence of mitral valve regurgitation. No evidence of mitral valve stenosis.  Tricuspid Valve: The tricuspid valve is not well visualized. Tricuspid valve regurgitation is not demonstrated. No evidence of tricuspid stenosis.  Aortic Valve: The aortic valve  was not well visualized. Aortic valve regurgitation is not visualized.  Pulmonic Valve: The pulmonic valve was not well visualized. Pulmonic valve regurgitation is not visualized. No evidence of pulmonic stenosis.  Aorta: The aortic root and ascending aorta are structurally normal, with no evidence of dilitation.  IAS/Shunts: No atrial level shunt detected by color flow Doppler.   LEFT VENTRICLE PLAX 2D LVIDd:         4.70 cm LVIDs:         2.90  cm LV PW:         1.20 cm LV IVS:        1.20 cm LVOT diam:     2.10 cm LV SV:         53 LV SV Index:   21 LVOT Area:     3.46 cm  LV Volumes (MOD) LV vol d, MOD A2C: 84.3 ml LV vol d, MOD A4C: 74.5 ml LV vol s, MOD A2C: 36.4 ml LV vol s, MOD A4C: 34.6 ml LV SV MOD A2C:     47.9 ml LV SV MOD A4C:     74.5 ml LV SV MOD BP:      45.8 ml  RIGHT VENTRICLE RV S prime:     15.30 cm/s TAPSE (M-mode): 2.0 cm  LEFT ATRIUM             Index        RIGHT ATRIUM           Index LA diam:        5.10 cm 2.08 cm/m   RA Area:     14.00 cm LA Vol (A2C):   45.0 ml 18.37 ml/m  RA Volume:   33.10 ml  13.51 ml/m LA Vol (A4C):   37.6 ml 15.35 ml/m LA Biplane Vol: 42.2 ml 17.23 ml/m AORTIC VALVE LVOT Vmax:   88.37 cm/s LVOT Vmean:  59.567 cm/s LVOT VTI:    0.152 m  AORTA Ao Root diam: 3.20 cm Ao Asc diam:  3.60 cm   SHUNTS Systemic VTI:  0.15 m Systemic Diam: 2.10 cm  Rudean Haskell MD Electronically signed by Rudean Haskell MD Signature Date/Time: 12/02/2022/4:37:54 PM    Final    MONITORS  LONG TERM MONITOR (3-14 DAYS) 11/29/2022  Narrative Patch Wear Time:  13 days and 8 hours  Predominant rhythm was atrial fibrillation/flutter, 56% burden Heart rate in atrial fibrillation 70 to 197 bpm, average 128 bpm Triggered episodes associated with atrial fibrillation 3% PACs Less than 1% PVCs  Will Curt Bears, MD            Recent Labs: 10/22/2022: ALT 23; B Natriuretic Peptide 49.7; BUN 13; Creatinine, Ser 1.15;  Hemoglobin 16.4; Magnesium 1.8; Platelets 245; Potassium 4.5; Sodium 137; TSH 1.985  Recent Lipid Panel    Component Value Date/Time   CHOL 174 05/09/2022 0953   TRIG 139 05/09/2022 0953   HDL 42 05/09/2022 0953   CHOLHDL 4.1 05/09/2022 0953   LDLCALC 107 (H) 05/09/2022 0953     Risk Assessment/Calculations:    CHA2DS2-VASc Score = 0   This indicates a 0.2% annual risk of stroke. The patient's score is based upon: CHF History: 0 HTN History: 0 Diabetes History: 0 Stroke History: 0 Vascular Disease History: 0 Age Score: 0 Gender Score: 0       Physical Exam:    VS:  BP 110/70   Pulse 92   Ht 5' 6.5" (1.689 m)   Wt (!) 323 lb 3.2 oz (146.6 kg)   SpO2 97%   BMI 51.38 kg/m     Wt Readings from Last 3 Encounters:  12/09/22 (!) 323 lb 3.2 oz (146.6 kg)  11/06/22 (!) 322 lb 6.4 oz (146.2 kg)  10/22/22 (!) 316 lb (143.3 kg)    GEN:  Morbid obesity, in no acute distress HEENT: Normal NECK: No JVD CARDIAC: RRR, no murmurs, rubs, gallops RESPIRATORY:  Clear to auscultation without rales, wheezing or rhonchi  ABDOMEN: Soft, non-tender, non-distended MUSCULOSKELETAL:  +  1 left leg edema; No deformity  SKIN: Warm and dry NEUROLOGIC:  Alert and oriented x 3 PSYCHIATRIC:  Normal affect   ASSESSMENT:    1. Paroxysmal atrial fibrillation (HCC)   2. Lower extremity edema   3. Morbid obesity ([place])    PLAN:    Paroxsymal Atrial Fibrillation, Morbid Obesity Daytime somnolence  - CHADSVASC=0. - would recommend ASA 81 mg PO Daily - TSH 1.985 - discussed alcohol, discussed exercise as preventive factors - sleep study is pending - short term I would schedule him for metoprolol 25 mg PO BID to get him through his 12/19/22 colonoscopy with rate control - SDM: he would like to see if with exercise and OSA tx he can be better rate control, he is asymptomatic with preserved LV function, will attempt this and start ASA 81 mg after coloscopy - if high AF burden after OSA treatment,  will need DOAC and mor aggressive therapy  LE edema - may be related to volume retention and AF - gave low dose lasix as a PRN  Needs AF f/u in the Spring; me or APP in the fall Sent message to GI; cancelled his AF appt today      Medication Adjustments/Labs and Tests Ordered: Current medicines are reviewed at length with the patient today.  Concerns regarding medicines are outlined above.  Orders Placed This Encounter  Procedures   EKG 12-Lead   Meds ordered this encounter  Medications   metoprolol tartrate (LOPRESSOR) 25 MG tablet    Sig: Take 1 tablet (25 mg total) by mouth 2 (two) times daily.    Dispense:  180 tablet    Refill:  3   furosemide (LASIX) 20 MG tablet    Sig: Take 1 tablet (20 mg total) by mouth daily as needed (leg swelling).    Dispense:  30 tablet    Refill:  11    Patient Instructions  Medication Instructions:  Your physician has recommended you make the following change in your medication:  START: metoprolol tartrate (Lopressor) 25 mg by mouth once daily  START: furosemide (Lasix) 20 mg by mouth once daily as needed for leg swelling *If you need a refill on your cardiac medications before your next appointment, please call your pharmacy*   Lab Work: NONE If you have labs (blood work) drawn today and your tests are completely normal, you will receive your results only by: [place] (if you have MyChart) OR A paper copy in the mail If you have any lab test that is abnormal or we need to change your treatment, we will call you to review the results.   Testing/Procedures: NONE   Follow-Up:With [facility] At [facility], you and your health needs are our priority.  As part of our continuing mission to provide you with exceptional heart care, we have created designated Provider Care Teams.  These Care Teams include your primary Cardiologist (physician) and Advanced Practice Providers (APPs -  Physician Assistants and Nurse  Practitioners) who all work together to provide you with the care you need, when you need it.    Signed, Werner Lean, MD  12/09/2022 2:42 PM    [place]

## 2022-12-09 NOTE — Patient Instructions (Signed)
Medication Instructions:  Your physician has recommended you make the following change in your medication:  START: metoprolol tartrate (Lopressor) 25 mg by mouth once daily  START: furosemide (Lasix) 20 mg by mouth once daily as needed for leg swelling *If you need a refill on your cardiac medications before your next appointment, please call your pharmacy*   Lab Work: NONE If you have labs (blood work) drawn today and your tests are completely normal, you will receive your results only by: [place] (if you have MyChart) OR A paper copy in the mail If you have any lab test that is abnormal or we need to change your treatment, we will call you to review the results.   Testing/Procedures: NONE   Follow-Up:With [facility] Clinic At [facility], you and your health needs are our priority.  As part of our continuing mission to provide you with exceptional heart care, we have created designated Provider Care Teams.  These Care Teams include your primary Cardiologist (physician) and Advanced Practice Providers (APPs -  Physician Assistants and Nurse Practitioners) who all work together to provide you with the care you need, when you need it.

## 2022-12-10 ENCOUNTER — Other Ambulatory Visit: Payer: Self-pay

## 2022-12-10 ENCOUNTER — Encounter (HOSPITAL_COMMUNITY): Payer: Self-pay | Admitting: Internal Medicine

## 2022-12-10 NOTE — Progress Notes (Signed)
COVID Vaccine Completed: yes  Date of COVID positive in last 90 days: yes, 2 months ago  PCP - Dorna Mai, MD Cardiologist - Rudean Haskell, MD LOV 12/09/22  Chest x-ray - 11/11/22 Epic EKG - 12/09/22 Epic-NSR Stress Test - n/a ECHO - 12/02/22 Epic Cardiac Cath - n/a Pacemaker/ICD device last checked: n/a Spinal Cord Stimulator: n/a  Bowel Prep - yes, pt stated he needs new instructions. Instructed him to call surgeon's office regarding bowel prep. Patient verbalized understanding   Sleep Study - waiting on results, test done CPAP -   Fasting Blood Sugar - preDM, pt denies Checks Blood Sugar _____ times a day  Last dose of GLP1 agonist-  N/A GLP1 instructions:  N/A   Last dose of SGLT-2 inhibitors-  N/A SGLT-2 instructions: N/A   Blood Thinner Instructions: n/a Aspirin Instructions: Last Dose:  Activity level: Can go up a flight of stairs and perform activities of daily living without stopping and without symptoms of chest pain or shortness of breath.   Anesthesia review: a fib, preDM, upper respiratory infection recently still has itchy throat. Chart sent to [place], [place] for review   Patient denies shortness of breath, fever, cough and chest pain at PAT appointment  Patient verbalized understanding of instructions that were given.

## 2022-12-13 ENCOUNTER — Encounter: Payer: Self-pay | Admitting: Internal Medicine

## 2022-12-16 ENCOUNTER — Encounter: Payer: Self-pay | Admitting: Internal Medicine

## 2022-12-17 ENCOUNTER — Telehealth: Payer: Self-pay | Admitting: Internal Medicine

## 2022-12-17 NOTE — Telephone Encounter (Signed)
Patient called requesting to speak with a nurse regarding his upcoming hospital procedure, said the last time he was scheduled they couldn't do the procedure because of him having HBP. He is concerned it will happen again and would like to get further advise on that.

## 2022-12-17 NOTE — Telephone Encounter (Signed)
Pt sent mychart yesterday,

## 2022-12-18 NOTE — Telephone Encounter (Signed)
See my chart message

## 2022-12-19 ENCOUNTER — Encounter (HOSPITAL_COMMUNITY): Payer: Self-pay | Admitting: Internal Medicine

## 2022-12-19 ENCOUNTER — Ambulatory Visit (HOSPITAL_COMMUNITY): Payer: BC Managed Care – PPO | Admitting: Physician Assistant

## 2022-12-19 ENCOUNTER — Ambulatory Visit (HOSPITAL_COMMUNITY)
Admission: RE | Admit: 2022-12-19 | Discharge: 2022-12-19 | Disposition: A | Discharge status: Home / Self Care | Payer: BC Managed Care – PPO | Attending: Internal Medicine | Admitting: Internal Medicine

## 2022-12-19 ENCOUNTER — Encounter (HOSPITAL_COMMUNITY)
Admission: RE | Disposition: A | Discharge status: Home / Self Care | Payer: Self-pay | Source: Home / Self Care | Attending: Internal Medicine

## 2022-12-19 ENCOUNTER — Other Ambulatory Visit: Payer: Self-pay

## 2022-12-19 ENCOUNTER — Encounter (HOSPITAL_BASED_OUTPATIENT_CLINIC_OR_DEPARTMENT_OTHER): Payer: Self-pay | Admitting: Cardiovascular Disease

## 2022-12-19 DIAGNOSIS — Z8249 Family history of ischemic heart disease and other diseases of the circulatory system: Secondary | ICD-10-CM | POA: Insufficient documentation

## 2022-12-19 DIAGNOSIS — I4891 Unspecified atrial fibrillation: Secondary | ICD-10-CM | POA: Diagnosis not present

## 2022-12-19 DIAGNOSIS — E669 Obesity, unspecified: Secondary | ICD-10-CM | POA: Insufficient documentation

## 2022-12-19 DIAGNOSIS — I10 Essential (primary) hypertension: Secondary | ICD-10-CM | POA: Insufficient documentation

## 2022-12-19 DIAGNOSIS — Z1211 Encounter for screening for malignant neoplasm of colon: Secondary | ICD-10-CM | POA: Diagnosis present

## 2022-12-19 DIAGNOSIS — D123 Benign neoplasm of transverse colon: Secondary | ICD-10-CM

## 2022-12-19 DIAGNOSIS — Z6841 Body Mass Index (BMI) 40.0 and over, adult: Secondary | ICD-10-CM | POA: Diagnosis not present

## 2022-12-19 DIAGNOSIS — R195 Other fecal abnormalities: Secondary | ICD-10-CM | POA: Diagnosis not present

## 2022-12-19 DIAGNOSIS — D125 Benign neoplasm of sigmoid colon: Secondary | ICD-10-CM | POA: Diagnosis not present

## 2022-12-19 DIAGNOSIS — K648 Other hemorrhoids: Secondary | ICD-10-CM | POA: Insufficient documentation

## 2022-12-19 HISTORY — DX: Essential (primary) hypertension: I10

## 2022-12-19 HISTORY — DX: Edema, unspecified: R60.9

## 2022-12-19 HISTORY — PX: COLONOSCOPY WITH PROPOFOL: SHX5780

## 2022-12-19 HISTORY — PX: POLYPECTOMY: SHX5525

## 2022-12-19 SURGERY — COLONOSCOPY WITH PROPOFOL
Anesthesia: Monitor Anesthesia Care

## 2022-12-19 MED ORDER — PROPOFOL 500 MG/50ML IV EMUL
INTRAVENOUS | Status: DC | PRN
  Administered 2022-12-19: 125 ug/kg/min via INTRAVENOUS

## 2022-12-19 MED ORDER — PROPOFOL 10 MG/ML IV BOLUS
INTRAVENOUS | Status: DC | PRN
  Administered 2022-12-19: 30 mg via INTRAVENOUS

## 2022-12-19 MED ORDER — LACTATED RINGERS IV SOLN: INTRAVENOUS | Status: DC

## 2022-12-19 SURGICAL SUPPLY — 22 items

## 2022-12-19 NOTE — Procedures (Signed)
     Patient Name: Bruce Marshall, Bruce Marshall Date: 12/07/2022 Gender: Male D.O.B: 1976/06/21 Age (years): 44 Referring Provider: Malka So PA Height (inches): 66 Interpreting Physician: Shelva Majestic MD, ABSM Weight (lbs): 310 RPSGT: Jacolyn Reedy BMI: 50 MRN: [id number] Neck Size: 21.00  CLINICAL INFORMATION Sleep Study Type: HST  Indication for sleep study: PAF, snoring, morbid obesity  Epworth Sleepiness Score: 1  SLEEP STUDY TECHNIQUE A multi-channel overnight portable sleep study was performed. The channels recorded were: nasal airflow, thoracic respiratory movement, and oxygen saturation with a pulse oximetry. Snoring was also monitored.  MEDICATIONS albuterol (VENTOLIN HFA) 108 (90 Base) MCG/ACT inhaler furosemide (LASIX) 20 MG tablet metoprolol tartrate (LOPRESSOR) 25 MG tablet Patient self administered medications include: N/A.  SLEEP ARCHITECTURE Patient was studied for 364.4 minutes. The sleep efficiency was 100.0 % and the patient was supine for 0%. The arousal index was 0.0 per hour.  RESPIRATORY PARAMETERS The overall AHI was 71.8 per hour, with a central apnea index of 0 per hour. Supine sleep AHI 100.8/h.  The oxygen nadir was 73% during sleep.   CARDIAC DATA Mean heart rate during sleep was 75.3 bpm. Heart rate range 46 - 107 bpm.  IMPRESSIONS - Severe obstructive sleep apnea occurred during this study (AH 71.8/h); events were more severe with supine sleep (AHI 100.8/h) - Severe oxygen desaturation to a nadir of 73%. Time spent < 89% was 18.1 minutes. - Patient snored for 94.6 minutes (26%) during the sleep.  DIAGNOSIS - Obstructive Sleep Apnea (G47.33) - Nocturnal Hypoxemia (G47.36)  RECOMMENDATIONS - In this patient with cardiovascular comorbidities and severe sleep apnea with marked hypoxemia recommend an in lab CPAP/BiPAP titration study.  If unable to schedule an in-lab titration study initate Auto PAP with EPR of 3 at 7 - 20 cm of  water. - Positional therapy avoiding supine position during sleep. - Avoid alcohol, sedatives and other CNS depressants that may worsen sleep apnea and disrupt normal sleep architecture. - Sleep hygiene should be reviewed to assess factors that may improve sleep quality. - Weight management (BMI 50) and regular exercise should be initiated or continued. - Recommend a download and sleep clinic evaluation after 4 - 6 weeks of therapy.   [Electronically signed] 12/19/2022 10:10 AM  Shelva Majestic MD, [facility], ABSM Diplomate, American Board of Sleep Medicine  NPI: [id number]  [place] PH: [phone]   FX: [phone] [place]

## 2022-12-19 NOTE — Anesthesia Preprocedure Evaluation (Signed)
Anesthesia Evaluation  Patient identified by MRN, date of birth, ID band Patient awake    Reviewed: Allergy & Precautions, NPO status , Patient's Chart, lab work & pertinent test results, reviewed documented beta blocker date and time   Airway Mallampati: II  TM Distance: >3 FB Neck ROM: Full    Dental  (+) Teeth Intact, Dental Advisory Given   Pulmonary     + decreased breath sounds      Cardiovascular hypertension, Pt. on medications and Pt. on home beta blockers + dysrhythmias Atrial Fibrillation  Rhythm:Regular Rate:Normal     Neuro/Psych    GI/Hepatic negative GI ROS, Neg liver ROS,,,  Endo/Other  negative endocrine ROS    Renal/GU negative Renal ROS     Musculoskeletal negative musculoskeletal ROS (+)    Abdominal  (+) + obese  Peds  Hematology negative hematology ROS (+)   Anesthesia Other Findings   Reproductive/Obstetrics                             Anesthesia Physical Anesthesia Plan  ASA: 3  Anesthesia Plan: MAC   Post-op Pain Management: Minimal or no pain anticipated   Induction: Intravenous  PONV Risk Score and Plan: Propofol infusion  Airway Management Planned: Natural Airway and Nasal CPAP  Additional Equipment: None  Intra-op Plan:   Post-operative Plan:   Informed Consent: I have reviewed the patients History and Physical, chart, labs and discussed the procedure including the risks, benefits and alternatives for the proposed anesthesia with the patient or authorized representative who has indicated his/her understanding and acceptance.       Plan Discussed with: CRNA  Anesthesia Plan Comments:        Anesthesia Quick Evaluation

## 2022-12-19 NOTE — Anesthesia Procedure Notes (Signed)
Procedure Name: MAC Date/Time: 12/19/2022 7:44 AM  Performed by: Claudia Desanctis, CRNAPre-anesthesia Checklist: Patient identified, Emergency Drugs available, Suction available and Patient being monitored Patient Re-evaluated:Patient Re-evaluated prior to induction Oxygen Delivery Method: Simple face mask

## 2022-12-19 NOTE — Transfer of Care (Signed)
Immediate Anesthesia Transfer of Care Note  Patient: Bruce Marshall  Procedure(s) Performed: COLONOSCOPY WITH PROPOFOL POLYPECTOMY  Patient Location: Endoscopy Unit  Anesthesia Type:MAC  Level of Consciousness: awake and patient cooperative  Airway & Oxygen Therapy: Patient Spontanous Breathing and Patient connected to nasal cannula oxygen  Post-op Assessment: Report given to RN and Post -op Vital signs reviewed and stable  Post vital signs: Reviewed and stable  Last Vitals:  Vitals Value Taken Time  BP    Temp    Pulse 76 12/19/22 0814  Resp 18 12/19/22 0814  SpO2 94 % 12/19/22 0814  Vitals shown include unvalidated device data.  Last Pain:  Vitals:   12/19/22 0813  TempSrc:   PainSc: 0-No pain         Complications: No notable events documented.

## 2022-12-19 NOTE — H&P (Signed)
GASTROENTEROLOGY PROCEDURE H&P NOTE   Primary Care Physician: Dorna Mai, MD    Reason for Procedure:  Positive Cologuard  Plan:    Colonoscopy  Patient is appropriate for endoscopic procedure(s) in the outpatient hospital setting  The nature of the procedure, as well as the risks, benefits, and alternatives were carefully and thoroughly reviewed with the patient. Ample time for discussion and questions allowed. The patient understood, was satisfied, and agreed to proceed.     HPI: Bruce Marshall is a 47 y.o. male who presents for colonoscopy.  Medical history as below.  Tolerated the prep.  No recent chest pain or shortness of breath.  No abdominal pain today.  Past Medical History:  Diagnosis Date   A-fib    Chest pain    Edema    lower extremitiy   Groin pain    Hypertension    borderline    Past Surgical History:  Procedure Laterality Date   PILONIDAL CYST DRAINAGE     WRIST SURGERY Right     Prior to Admission medications   Medication Sig Start Date End Date Taking? Authorizing Provider  furosemide (LASIX) 20 MG tablet Take 1 tablet (20 mg total) by mouth daily as needed (leg swelling). Patient taking differently: Take 20 mg by mouth daily. 12/09/22  Yes Chandrasekhar, Mahesh A, MD  metoprolol tartrate (LOPRESSOR) 25 MG tablet Take 1 tablet (25 mg total) by mouth 2 (two) times daily. 12/09/22  Yes Chandrasekhar, Mahesh A, MD  albuterol (VENTOLIN HFA) 108 (90 Base) MCG/ACT inhaler Inhale into the lungs as needed for wheezing or shortness of breath. 11/18/22   [provider]    Current Facility-Administered Medications  Medication Dose Route Frequency Provider Last Rate Last Admin   lactated ringers infusion   Intravenous Continuous Kamariya Blevens, Lajuan Lines, MD 50 mL/hr at 12/19/22 0718 Restarted at 12/19/22 [id number]    Allergies as of 10/22/2022   (No Known Allergies)    Family History  Problem Relation Age of Onset   Healthy Daughter    Heart attack  Maternal Uncle    Cancer Neg Hx    Stroke Neg Hx    Colon cancer Neg Hx    Esophageal cancer Neg Hx     Social History   Socioeconomic History   Marital status: Married    Spouse name: Not on file   Number of children: Not on file   Years of education: Not on file   Highest education level: Not on file  Occupational History   Not on file  Tobacco Use   Smoking status: Never   Smokeless tobacco: Never   Tobacco comments:    Never smoke 11/06/22  Vaping Use   Vaping Use: Never used  Substance and Sexual Activity   Alcohol use: Not Currently   Drug use: Not Currently   Sexual activity: Not on file  Other Topics Concern   Not on file  Social History Narrative   Not on file   Social Determinants of Health   Financial Resource Strain: Not on file  Food Insecurity: Not on file  Transportation Needs: Not on file  Physical Activity: Not on file  Stress: Not on file  Social Connections: Not on file  Intimate Partner Violence: Not on file    Physical Exam: Vital signs in last 24 hours: @BP  (!) 145/80   Pulse 81   Temp 97.7 F (36.5 C) (Temporal)   Resp 18   Ht 5\' 6"  (1.676 m)   Wt Marland Kitchen)  145.5 kg   SpO2 96%   BMI 51.77 kg/m  GEN: NAD EYE: Sclerae anicteric ENT: MMM CV: Non-tachycardic Pulm: CTA b/l GI: Soft, NT/ND NEURO:  Alert & Oriented x 3   Zenovia Jarred, MD [facility]  12/19/2022 7:35 AM

## 2022-12-19 NOTE — Discharge Instructions (Signed)

## 2022-12-19 NOTE — Anesthesia Postprocedure Evaluation (Signed)
Anesthesia Post Note  Patient: Bruce Marshall  Procedure(s) Performed: COLONOSCOPY WITH PROPOFOL POLYPECTOMY     Patient location during evaluation: PACU Anesthesia Type: MAC Level of consciousness: awake and alert Pain management: pain level controlled Vital Signs Assessment: post-procedure vital signs reviewed and stable Respiratory status: spontaneous breathing, nonlabored ventilation, respiratory function stable and patient connected to nasal cannula oxygen Cardiovascular status: stable and blood pressure returned to baseline Postop Assessment: no apparent nausea or vomiting Anesthetic complications: no  No notable events documented.  Last Vitals:  Vitals:   12/19/22 0820 12/19/22 0827  BP: 108/60 117/73  Pulse: 70 72  Resp: (!) 21 13  Temp:    SpO2: 95% 94%    Last Pain:  Vitals:   12/19/22 0827  TempSrc:   PainSc: 0-No pain                 Effie Berkshire

## 2022-12-19 NOTE — Op Note (Signed)
[facility] Patient Name: Bruce Marshall Procedure Date: 12/19/2022 MRN: [id number] Attending MD: Jerene Bears , MD, [id number] Date of Birth: 01/21/1976 CSN: [id number] Age: 47 Admit Type: Outpatient Procedure:                Colonoscopy Indications:              Positive Cologuard test Providers:                Lajuan Lines. Hilarie Fredrickson, MD, Vladimir Crofts, RN, William Dalton, Technician Referring MD:             Patricia Pesa. Wilson Medicines:                Monitored Anesthesia Care Complications:            No immediate complications. Estimated Blood Loss:     Estimated blood loss was minimal. Procedure:                Pre-Anesthesia Assessment:                           - Prior to the procedure, a History and Physical                            was performed, and patient medications and                            allergies were reviewed. The patient's tolerance of                            previous anesthesia was also reviewed. The risks                            and benefits of the procedure and the sedation                            options and risks were discussed with the patient.                            All questions were answered, and informed consent                            was obtained. Prior Anticoagulants: The patient has                            taken no anticoagulant or antiplatelet agents. ASA                            Grade Assessment: III - A patient with severe                            systemic disease. After reviewing the risks and  benefits, the patient was deemed in satisfactory                            condition to undergo the procedure.                           After obtaining informed consent, the colonoscope                            was passed under direct vision. Throughout the                            procedure, the patient's blood pressure, pulse, and                             oxygen saturations were monitored continuously. The                            [id number]) Olympus colonoscope was                            introduced through the anus and advanced to the                            cecum, identified by palpation. The [id number]                            [id number]) Olympus colonoscope was introduced                            through the and advanced to the. The colonoscopy                            was performed without difficulty. The patient                            tolerated the procedure well. The quality of the                            bowel preparation was excellent. The ileocecal                            valve, appendiceal orifice, and rectum were                            photographed. Scope In: 7:49:44 AM Scope Out: 8:05:05 AM Scope Withdrawal Time: 0 hours 13 minutes 28 seconds  Total Procedure Duration: 0 hours 15 minutes 21 seconds  Findings:      The digital rectal exam was normal.      Three sessile polyps were found in the transverse colon. The polyps were       3 to 6 mm in size. These polyps were removed with a cold snare.       Resection and retrieval were complete.      A 4 mm polyp was  found in the sigmoid colon. The polyp was sessile. The       polyp was removed with a cold snare. Resection and retrieval were       complete.      Internal hemorrhoids were found during retroflexion. The hemorrhoids       were small. Impression:               - Three 3 to 6 mm polyps in the transverse colon,                            removed with a cold snare. Resected and retrieved.                           - One 4 mm polyp in the sigmoid colon, removed with                            a cold snare. Resected and retrieved.                           - Small internal hemorrhoids. Moderate Sedation:      N/A Recommendation:           - Patient has a contact number available for                            emergencies. The signs and  symptoms of potential                            delayed complications were discussed with the                            patient. Return to normal activities tomorrow.                            Written discharge instructions were provided to the                            patient.                           - Resume previous diet.                           - Continue present medications.                           - Await pathology results.                           - Repeat colonoscopy is recommended for                            surveillance. The colonoscopy date will be                            determined after pathology results from today's  exam become available for review. Procedure Code(s):        --- Professional ---                           [phone], Colonoscopy, flexible; with removal of                            tumor(s), polyp(s), or other lesion(s) by snare                            technique Diagnosis Code(s):        --- Professional ---                           D12.3, Benign neoplasm of transverse colon (hepatic                            flexure or splenic flexure)                           D12.5, Benign neoplasm of sigmoid colon                           K64.8, Other hemorrhoids                           R19.5, Other fecal abnormalities CPT copyright 2022 American Medical Association. All rights reserved. The codes documented in this report are preliminary and upon coder review may  be revised to meet current compliance requirements. Jerene Bears, MD 12/19/2022 8:18:23 AM This report has been signed electronically. Number of Addenda: 0

## 2022-12-20 ENCOUNTER — Encounter: Payer: Self-pay | Admitting: Internal Medicine

## 2022-12-20 LAB — SURGICAL PATHOLOGY

## 2022-12-22 ENCOUNTER — Encounter (HOSPITAL_COMMUNITY): Payer: Self-pay | Admitting: Internal Medicine

## 2023-02-17 ENCOUNTER — Ambulatory Visit (HOSPITAL_COMMUNITY)
Admission: RE | Admit: 2023-02-17 | Discharge: 2023-02-17 | Disposition: A | Discharge status: Home / Self Care | Payer: BC Managed Care – PPO | Source: Ambulatory Visit | Attending: Physician Assistant | Admitting: Physician Assistant

## 2023-02-17 VITALS — BP 122/76 | HR 104 | Ht 66.0 in | Wt 314.2 lb

## 2023-02-17 DIAGNOSIS — Z6841 Body Mass Index (BMI) 40.0 and over, adult: Secondary | ICD-10-CM | POA: Insufficient documentation

## 2023-02-17 DIAGNOSIS — Z79899 Other long term (current) drug therapy: Secondary | ICD-10-CM | POA: Diagnosis not present

## 2023-02-17 DIAGNOSIS — G4733 Obstructive sleep apnea (adult) (pediatric): Secondary | ICD-10-CM | POA: Insufficient documentation

## 2023-02-17 DIAGNOSIS — I4892 Unspecified atrial flutter: Secondary | ICD-10-CM | POA: Diagnosis not present

## 2023-02-17 DIAGNOSIS — E669 Obesity, unspecified: Secondary | ICD-10-CM | POA: Insufficient documentation

## 2023-02-17 DIAGNOSIS — I484 Atypical atrial flutter: Secondary | ICD-10-CM | POA: Diagnosis present

## 2023-02-17 DIAGNOSIS — I480 Paroxysmal atrial fibrillation: Secondary | ICD-10-CM | POA: Insufficient documentation

## 2023-02-17 MED ORDER — FLECAINIDE ACETATE 100 MG PO TABS: 100.0000 mg | ORAL_TABLET | Freq: Two times a day (BID) | ORAL | 3 refills | Status: DC

## 2023-02-17 NOTE — Progress Notes (Signed)
Primary Care Physician: Georganna Skeans, MD Primary Cardiologist: Dr Izora Ribas  Primary Electrophysiologist: none Referring Physician: [facility]   Bruce Marshall is a 47 y.o. male with a history of OSA and atrial fibrillation who presents for follow up in the [facility].  The patient was initially diagnosed with atrial fibrillation 10/22/22 after for a colonoscopy with Dr Rhea Belton for a positive Cologuard. He was beign prepped when his heart rate was noted to be 130s-140s bpm. An ECG was done which showed afib with RVR (ECG not available for review). The colonoscopy was cancelled and he was sent to the ED for evaluation. He was given IV metoprolol and converted to SR. Patient was not started on anticoagulation for a CHADS2VASC score of 0. He was discharged with PRN metoprolol. Patient reported at his visit 11/06/22 that for the previous 6 months he has had "fluttering" in his chest at night. He did not seek evaluation because it always resolved quickly. He also noticed occasional heart rates in the 150s bpm on his smart watch.   Zio monitor placed 11/06/22 showed 56% afib burden, started on metoprolol BID. Patient also had a sleep study 12/06/22 which showed severe OSA.  On follow up today, patient reports that he is having frequent episodes of afib and flutter. At least 1-2 episodes per day. He has a smart watch which shows he is paroxysmal. He did not tolerate higher doses of BB due to feeling "out of it".   Today, he denies symptoms of chest pain, shortness of breath, orthopnea, PND, lower extremity edema, dizziness, presyncope, syncope, bleeding, or neurologic sequela. The patient is tolerating medications without difficulties and is otherwise without complaint today.    Atrial Fibrillation Risk Factors:  he does have symptoms or diagnosis of sleep apnea. he is waiting on CPAP titration.  he does not have a history of rheumatic fever. The patient does not  have a history of early familial atrial fibrillation or other arrhythmias.  he has a BMI of Body mass index is 50.71 kg/m.Marland Kitchen Filed Weights   02/17/23 0852  Weight: (!) 142.5 kg   Family History  Problem Relation Age of Onset   Healthy Daughter    Heart attack Maternal Uncle    Cancer Neg Hx    Stroke Neg Hx    Colon cancer Neg Hx    Esophageal cancer Neg Hx      Atrial Fibrillation Management history:  Previous antiarrhythmic drugs: none Previous cardioversions: none Previous ablations: none Anticoagulation history: none   Past Medical History:  Diagnosis Date   A-fib (HCC)    Chest pain    Edema    lower extremitiy   Groin pain    Hypertension    borderline   Past Surgical History:  Procedure Laterality Date   COLONOSCOPY WITH PROPOFOL N/A 12/19/2022   Procedure: COLONOSCOPY WITH PROPOFOL;  Surgeon: Beverley Fiedler, MD;  Location: [facility]COPY;  Service: Gastroenterology;  Laterality: N/A;   PILONIDAL CYST DRAINAGE     POLYPECTOMY  12/19/2022   Procedure: POLYPECTOMY;  Surgeon: Beverley Fiedler, MD;  Location: [facility]OS[facility];  Service: Gastroenterology;;   WRIST SURGERY Right     Current Outpatient Medications  Medication Sig Dispense Refill   albuterol (VENTOLIN HFA) 108 (90 Base) MCG/ACT inhaler Inhale into the lungs as needed for wheezing or shortness of breath.     furosemide (LASIX) 20 MG tablet Take 1 tablet (20 mg total) by mouth daily as needed (leg  swelling). (Patient taking differently: Take 20 mg by mouth as needed.) 30 tablet 11   metoprolol tartrate (LOPRESSOR) 25 MG tablet Take 1 tablet (25 mg total) by mouth 2 (two) times daily. 180 tablet 3   No current facility-administered medications for this encounter.    No Known Allergies  Social History   Socioeconomic History   Marital status: Married    Spouse name: Not on file   Number of children: Not on file   Years of education: Not on file   Highest education level: Not on file  Occupational History    Not on file  Tobacco Use   Smoking status: Never   Smokeless tobacco: Never   Tobacco comments:    Never smoke 11/06/22  Vaping Use   Vaping Use: Never used  Substance and Sexual Activity   Alcohol use: Not Currently   Drug use: Not Currently   Sexual activity: Not on file  Other Topics Concern   Not on file  Social History Narrative   Not on file   Social Determinants of Health   Financial Resource Strain: Not on file  Food Insecurity: Not on file  Transportation Needs: Not on file  Physical Activity: Not on file  Stress: Not on file  Social Connections: Not on file  Intimate Partner Violence: Not on file     ROS- All systems are reviewed and negative except as per the HPI above.  Physical Exam: Vitals:   02/17/23 0852  BP: 122/76  Pulse: (!) 104  Weight: (!) 142.5 kg  Height: 5\' 6"  (1.676 m)     GEN- The patient is a well appearing male, alert and oriented x 3 today.   HEENT-head normocephalic, atraumatic, sclera clear, conjunctiva pink, hearing intact, trachea midline. Lungs- Clear to ausculation bilaterally, normal work of breathing Heart- irregular rate and rhythm, no murmurs, rubs or gallops  GI- soft, NT, ND, + BS Extremities- no clubbing, cyanosis, or edema MS- no significant deformity or atrophy Skin- no rash or lesion Psych- euthymic mood, full affect Neuro- strength and sensation are intact   Wt Readings from Last 3 Encounters:  02/17/23 (!) 142.5 kg  12/19/22 (!) 145.5 kg  12/09/22 (!) 146.6 kg    EKG today demonstrates  Atypical atrial flutter with variable block Vent. rate 104 BPM PR interval * ms QRS duration 76 ms QT/QTcB 342/449 ms   Echo 12/02/22 1. Left ventricular ejection fraction, by estimation, is 55 to 60%. The  left ventricle has normal function. The left ventricle has no regional  wall motion abnormalities. There is mild concentric left ventricular  hypertrophy. Left ventricular diastolic parameters are indeterminate.    2. Right ventricular systolic function is normal. The right ventricular  size is normal.   3. The mitral valve is normal in structure. No evidence of mitral valve  regurgitation. No evidence of mitral stenosis.   4. The aortic valve was not well visualized. Aortic valve regurgitation  is not visualized.   Comparison(s): No prior Echocardiogram.    Epic records are reviewed at length today  CHA2DS2-VASc Score = 0  The patient's score is based upon: CHF History: 0 HTN History: 0 Diabetes History: 0 Stroke History: 0 Vascular Disease History: 0 Age Score: 0 Gender Score: 0       ASSESSMENT AND PLAN: 1. Paroxysmal Atrial Fibrillation/atrial flutter The patient's CHA2DS2-VASc score is 0, indicating a 0.2% annual risk of stroke.   Patient having frequent episodes. We discussed rhythm control options including flecainide  and Multaq. Will start flecainide 100 mg BID. Recheck ECG in one week. If he is doing well with the medication, he will then need to be set up for TST.  Continue Lopressor 25 mg BID  Not currently on anticoagulation with CV score of 0.   2. Obesity Body mass index is 50.71 kg/m. Lifestyle modification was discussed and encouraged including regular physical activity and weight reduction.  3. OSA Severe on study 12/06/22 He will need CPAP titration with Dr Tresa Endo. Will send back to sleep medicine team.    Follow up with Dr Izora Ribas or APP in 4 months. AF clinic next week for ECG.    [name] PA-C [facility] [address] [place], [place] [address] [phone] 02/17/2023 9:27 AM

## 2023-02-17 NOTE — Patient Instructions (Signed)
Start Flecainide 100mg twice a day 

## 2023-02-24 ENCOUNTER — Ambulatory Visit (HOSPITAL_COMMUNITY)
Admission: RE | Admit: 2023-02-24 | Discharge: 2023-02-24 | Disposition: A | Discharge status: Home / Self Care | Payer: BC Managed Care – PPO | Source: Ambulatory Visit | Attending: Internal Medicine | Admitting: Internal Medicine

## 2023-02-24 DIAGNOSIS — I480 Paroxysmal atrial fibrillation: Secondary | ICD-10-CM | POA: Diagnosis not present

## 2023-02-24 DIAGNOSIS — Z79899 Other long term (current) drug therapy: Secondary | ICD-10-CM | POA: Insufficient documentation

## 2023-02-24 MED ORDER — APIXABAN 5 MG PO TABS: 5.0000 mg | ORAL_TABLET | Freq: Two times a day (BID) | ORAL | 3 refills | Status: AC

## 2023-02-24 NOTE — Patient Instructions (Signed)
If continue out of rhythm for 48 hours then start eliquis 5mg  twice a day

## 2023-02-24 NOTE — Progress Notes (Signed)
Patient doing well with no concerns. Flecainide 100 mg BID started 6/3 for frequent paroxysmal Afib and flutter. Pt is here for 1 week ECG recheck. He missed a dose of flecainide 2 nights ago. He has been sick and went to UC over the weekend. He has not yet filled either the prednisone or omnicef yet to take. He notes via Apple watch has been in Afib since last night; he states this is his longest period of Afib. After discussion, will schedule f/u visit next week and out of precaution send Eliquis 5 mg BID to pharmacy. Pt instructed to take Eliquis 5 mg BID if he approaches 48 hours of being out of rhythm, which may be possible during acute illness / taking prednisone.    ECG today shows  Vent. rate 90 BPM PR interval * ms QRS duration 90 ms QT/QTcB 368/450 ms P-R-T axes * 42 36 Atrial fibrillation Abnormal ECG When compared with ECG of 17-Feb-2023 09:16, PREVIOUS ECG IS PRESENT   Pt will follow up 1 wk with Ricky.

## 2023-03-04 ENCOUNTER — Telehealth: Payer: Self-pay | Admitting: Cardiovascular Disease

## 2023-03-04 ENCOUNTER — Encounter (HOSPITAL_COMMUNITY): Payer: Self-pay | Admitting: Physician Assistant

## 2023-03-04 ENCOUNTER — Ambulatory Visit (HOSPITAL_COMMUNITY)
Admission: RE | Admit: 2023-03-04 | Discharge: 2023-03-04 | Disposition: A | Discharge status: Home / Self Care | Payer: BC Managed Care – PPO | Source: Ambulatory Visit | Attending: Physician Assistant | Admitting: Physician Assistant

## 2023-03-04 VITALS — BP 132/78 | HR 86 | Ht 66.0 in | Wt 314.8 lb

## 2023-03-04 DIAGNOSIS — Z7182 Exercise counseling: Secondary | ICD-10-CM | POA: Diagnosis not present

## 2023-03-04 DIAGNOSIS — I480 Paroxysmal atrial fibrillation: Secondary | ICD-10-CM | POA: Diagnosis present

## 2023-03-04 DIAGNOSIS — Z6841 Body Mass Index (BMI) 40.0 and over, adult: Secondary | ICD-10-CM | POA: Diagnosis not present

## 2023-03-04 DIAGNOSIS — Z7901 Long term (current) use of anticoagulants: Secondary | ICD-10-CM | POA: Diagnosis not present

## 2023-03-04 DIAGNOSIS — Z79899 Other long term (current) drug therapy: Secondary | ICD-10-CM

## 2023-03-04 DIAGNOSIS — G4733 Obstructive sleep apnea (adult) (pediatric): Secondary | ICD-10-CM | POA: Diagnosis not present

## 2023-03-04 DIAGNOSIS — E669 Obesity, unspecified: Secondary | ICD-10-CM | POA: Diagnosis not present

## 2023-03-04 DIAGNOSIS — Z5181 Encounter for therapeutic drug level monitoring: Secondary | ICD-10-CM | POA: Diagnosis not present

## 2023-03-04 DIAGNOSIS — I4892 Unspecified atrial flutter: Secondary | ICD-10-CM | POA: Diagnosis not present

## 2023-03-04 MED ORDER — FLECAINIDE ACETATE 150 MG PO TABS: 150.0000 mg | ORAL_TABLET | Freq: Two times a day (BID) | ORAL | 3 refills | Status: DC

## 2023-03-04 NOTE — Patient Instructions (Signed)
Increase flecainide to 150mg  twice a day   Dr Tresa Endo -- [phone]

## 2023-03-04 NOTE — Telephone Encounter (Signed)
Pt calling in today about his sleep study results and he would like to know what his next steps are. Please advise

## 2023-03-04 NOTE — Progress Notes (Signed)
Primary Care Physician: Georganna Skeans, MD Primary Cardiologist: Dr Izora Ribas  Primary Electrophysiologist: none Referring Physician: [facility]   Bruce Marshall is a 47 y.o. male with a history of OSA and atrial fibrillation who presents for follow up in the [facility].  The patient was initially diagnosed with atrial fibrillation 10/22/22 after for a colonoscopy with Dr Rhea Belton for a positive Cologuard. He was beign prepped when his heart rate was noted to be 130s-140s bpm. An ECG was done which showed afib with RVR (ECG not available for review). The colonoscopy was cancelled and he was sent to the ED for evaluation. He was given IV metoprolol and converted to SR. Patient was not started on anticoagulation for a CHADS2VASC score of 0. He was discharged with PRN metoprolol. Patient reported at his visit 11/06/22 that for the previous 6 months he has had "fluttering" in his chest at night. He did not seek evaluation because it always resolved quickly. He also noticed occasional heart rates in the 150s bpm on his smart watch.   Zio monitor placed 11/06/22 showed 56% afib burden, started on metoprolol BID. Patient also had a sleep study 12/06/22 which showed severe OSA.  On follow up today, patient has continued to have frequent episodes of afib, at least daily. He is recovering from an URI as well. He is rate controlled and feels reasonably well today.   Today, he denies symptoms of chest pain, shortness of breath, orthopnea, PND, lower extremity edema, dizziness, presyncope, syncope, bleeding, or neurologic sequela. The patient is tolerating medications without difficulties and is otherwise without complaint today.    Atrial Fibrillation Risk Factors:  he does have symptoms or diagnosis of sleep apnea. he is waiting on CPAP titration.  he does not have a history of rheumatic fever. The patient does not have a history of early familial atrial fibrillation or  other arrhythmias.  he has a BMI of Body mass index is 50.81 kg/m.Marland Kitchen Filed Weights   03/04/23 0826  Weight: (!) 142.8 kg    Atrial Fibrillation Management history:  Previous antiarrhythmic drugs: flecainide  Previous cardioversions: none Previous ablations: none Anticoagulation history: none   Past Medical History:  Diagnosis Date   A-fib (HCC)    Chest pain    Edema    lower extremitiy   Groin pain    Hypertension    borderline   Past Surgical History:  Procedure Laterality Date   COLONOSCOPY WITH PROPOFOL N/A 12/19/2022   Procedure: COLONOSCOPY WITH PROPOFOL;  Surgeon: Beverley Fiedler, MD;  Location: [facility];  Service: Gastroenterology;  Laterality: N/A;   PILONIDAL CYST DRAINAGE     POLYPECTOMY  12/19/2022   Procedure: POLYPECTOMY;  Surgeon: Beverley Fiedler, MD;  Location: [facility];  Service: Gastroenterology;;   WRIST SURGERY Right     ROS- All systems are reviewed and negative except as per the HPI above.  Physical Exam: Vitals:   03/04/23 0826  BP: 132/78  Pulse: 86  Weight: (!) 142.8 kg  Height: 5\' 6"  (1.676 m)    GEN- The patient is a well appearing male, alert and oriented x 3 today.   HEENT-head normocephalic, atraumatic, sclera clear, conjunctiva pink, hearing intact, trachea midline. Lungs- Clear to ausculation bilaterally, normal work of breathing Heart- irregular rate and rhythm, no murmurs, rubs or gallops  GI- soft, NT, ND, + BS Extremities- no clubbing, cyanosis, or edema MS- no significant deformity or atrophy Skin- no rash or lesion Psych-  euthymic mood, full affect Neuro- strength and sensation are intact   Wt Readings from Last 3 Encounters:  03/04/23 (!) 142.8 kg  02/17/23 (!) 142.5 kg  12/19/22 (!) 145.5 kg    EKG today demonstrates  Coarse afib vs atrial flutter with variable block Vent. rate 86 BPM PR interval * ms QRS duration 90 ms QT/QTcB 364/435 ms   Echo 12/02/22 1. Left ventricular ejection fraction, by  estimation, is 55 to 60%. The  left ventricle has normal function. The left ventricle has no regional  wall motion abnormalities. There is mild concentric left ventricular  hypertrophy. Left ventricular diastolic parameters are indeterminate.   2. Right ventricular systolic function is normal. The right ventricular  size is normal.   3. The mitral valve is normal in structure. No evidence of mitral valve  regurgitation. No evidence of mitral stenosis.   4. The aortic valve was not well visualized. Aortic valve regurgitation  is not visualized.   Comparison(s): No prior Echocardiogram.    Epic records are reviewed at length today  CHA2DS2-VASc Score = 0  The patient's score is based upon: CHF History: 0 HTN History: 0 Diabetes History: 0 Stroke History: 0 Vascular Disease History: 0 Age Score: 0 Gender Score: 0       ASSESSMENT AND PLAN: 1. Paroxysmal Atrial Fibrillation/atrial flutter The patient's CHA2DS2-VASc score is 0, indicating a 0.2% annual risk of stroke.   Patient having very frequent episodes. Apple Watch strips personally reviewed today. He was in SR yesterday.  Will increase flecainide to 150 mg BID. If he fails to stay in SR, will need to consider alternate AAD. BMI is prohibitive for ablation.  Continue Lopressor 25 mg BID  Patient not currently taking anticoagulation with low CV score and paroxysmal nature of afib. He does have an Rx of Eliquis on hand if he becomes persistent (longer than 48 hours).  Suspect untreated severe OSA contributing.  Apple Watch for home monitoring.   2. Obesity Body mass index is 50.81 kg/m. Lifestyle modification was discussed and encouraged including regular physical activity and weight reduction.  3. OSA Severe on study 12/06/22 Phone number to Dr Landry Dyke office given to patient to arrange CPAP titration.    Follow up in the AF clinic next week for ECG.    [name] PA-C Af[facility] [address] [place], [place] [address] [phone] 03/04/2023 8:36 AM

## 2023-03-10 ENCOUNTER — Ambulatory Visit (HOSPITAL_COMMUNITY)
Admission: RE | Admit: 2023-03-10 | Discharge: 2023-03-10 | Disposition: A | Discharge status: Home / Self Care | Payer: BC Managed Care – PPO | Source: Ambulatory Visit | Attending: Physician Assistant | Admitting: Physician Assistant

## 2023-03-10 DIAGNOSIS — I480 Paroxysmal atrial fibrillation: Secondary | ICD-10-CM | POA: Diagnosis present

## 2023-03-10 DIAGNOSIS — Z79899 Other long term (current) drug therapy: Secondary | ICD-10-CM | POA: Insufficient documentation

## 2023-03-10 NOTE — Progress Notes (Signed)
Patient returns for ECG after increasing flecainide. ECG shows:  SR HR 62 bpm PR 194 QRS 90 ms QTc 436  Patient had one episode of afib since starting the higher dose, otherwise has been staying in SR. Tolerating the medication without difficulty. Continue flecainide 150 mg BID. F/u in the AF clinic in 3 months.

## 2023-03-25 NOTE — Telephone Encounter (Signed)
Left detailed VM, per DPR, of sleep study results and recommendations. Left number for patient to return call.

## 2023-06-09 ENCOUNTER — Ambulatory Visit (HOSPITAL_COMMUNITY)
Admission: RE | Admit: 2023-06-09 | Discharge: 2023-06-09 | Disposition: A | Discharge status: Home / Self Care | Payer: BC Managed Care – PPO | Source: Ambulatory Visit | Attending: Physician Assistant | Admitting: Physician Assistant

## 2023-06-09 ENCOUNTER — Encounter (HOSPITAL_COMMUNITY): Payer: Self-pay | Admitting: Physician Assistant

## 2023-06-09 VITALS — BP 130/70 | HR 69 | Ht 66.0 in | Wt 320.2 lb

## 2023-06-09 DIAGNOSIS — I480 Paroxysmal atrial fibrillation: Secondary | ICD-10-CM | POA: Diagnosis not present

## 2023-06-09 DIAGNOSIS — Z79899 Other long term (current) drug therapy: Secondary | ICD-10-CM

## 2023-06-09 DIAGNOSIS — Z6841 Body Mass Index (BMI) 40.0 and over, adult: Secondary | ICD-10-CM | POA: Insufficient documentation

## 2023-06-09 DIAGNOSIS — G4733 Obstructive sleep apnea (adult) (pediatric): Secondary | ICD-10-CM | POA: Diagnosis present

## 2023-06-09 DIAGNOSIS — I4891 Unspecified atrial fibrillation: Secondary | ICD-10-CM | POA: Diagnosis present

## 2023-06-09 DIAGNOSIS — Z5181 Encounter for therapeutic drug level monitoring: Secondary | ICD-10-CM | POA: Diagnosis not present

## 2023-06-09 DIAGNOSIS — E669 Obesity, unspecified: Secondary | ICD-10-CM | POA: Insufficient documentation

## 2023-06-09 DIAGNOSIS — I4892 Unspecified atrial flutter: Secondary | ICD-10-CM | POA: Diagnosis not present

## 2023-06-09 MED ORDER — METOPROLOL TARTRATE 25 MG PO TABS: 25.0000 mg | ORAL_TABLET | Freq: Two times a day (BID) | ORAL | 3 refills | Status: DC

## 2023-06-09 NOTE — Progress Notes (Signed)
Primary Care Physician: Georganna Skeans, MD Primary Cardiologist: Dr Izora Ribas  Primary Electrophysiologist: none Referring Physician: [facility]   Bruce Marshall is a 47 y.o. male with a history of OSA and atrial fibrillation who presents for follow up in the [facility].  The patient was initially diagnosed with atrial fibrillation 10/22/22 after for a colonoscopy with Dr Rhea Belton for a positive Cologuard. He was beign prepped when his heart rate was noted to be 130s-140s bpm. An ECG was done which showed afib with RVR (ECG not available for review). The colonoscopy was cancelled and he was sent to the ED for evaluation. He was given IV metoprolol and converted to SR. Patient was not started on anticoagulation for a CHADS2VASC score of 0. He was discharged with PRN metoprolol. Patient reported at his visit 11/06/22 that for the previous 6 months he has had "fluttering" in his chest at night. He did not seek evaluation because it always resolved quickly. He also noticed occasional heart rates in the 150s bpm on his smart watch.   Zio monitor placed 11/06/22 showed 56% afib burden, started on metoprolol BID. Patient also had a sleep study 12/06/22 which showed severe OSA. He was started on flecainide, increased on 03/04/23.  On follow up today, patient reports that he has done well overall since his last visit. He has occasionally missed his morning dose of medications and will have afib for a couple hours but this resolves when he takes his next dose.   Today, he denies symptoms of chest pain, shortness of breath, orthopnea, PND, lower extremity edema, dizziness, presyncope, syncope, bleeding, or neurologic sequela. The patient is tolerating medications without difficulties and is otherwise without complaint today.    Atrial Fibrillation Risk Factors:  he does have symptoms or diagnosis of sleep apnea. he is waiting on CPAP titration.  he does not have a history of  rheumatic fever. The patient does not have a history of early familial atrial fibrillation or other arrhythmias.   Atrial Fibrillation Management history:  Previous antiarrhythmic drugs: flecainide  Previous cardioversions: none Previous ablations: none Anticoagulation history: none   Past Medical History:  Diagnosis Date   A-fib (HCC)    Chest pain    Edema    lower extremitiy   Groin pain    Hypertension    borderline   Past Surgical History:  Procedure Laterality Date   COLONOSCOPY WITH PROPOFOL N/A 12/19/2022   Procedure: COLONOSCOPY WITH PROPOFOL;  Surgeon: Beverley Fiedler, MD;  Location: [facility];  Service: Gastroenterology;  Laterality: N/A;   PILONIDAL CYST DRAINAGE     POLYPECTOMY  12/19/2022   Procedure: POLYPECTOMY;  Surgeon: Beverley Fiedler, MD;  Location: [facility];  Service: Gastroenterology;;   WRIST SURGERY Right     ROS- All systems are reviewed and negative except as per the HPI above.  Physical Exam: Vitals:   06/09/23 0857  BP: 130/70  Pulse: 69  Weight: (!) 145.2 kg  Height: 5\' 6"  (1.676 m)    GEN: Well nourished, well developed in no acute distress NECK: No JVD; No carotid bruits CARDIAC: Regular rate and rhythm, no murmurs, rubs, gallops RESPIRATORY:  Clear to auscultation without rales, wheezing or rhonchi  ABDOMEN: Soft, non-tender, non-distended EXTREMITIES:  No edema; No deformity    Wt Readings from Last 3 Encounters:  06/09/23 (!) 145.2 kg  03/04/23 (!) 142.8 kg  02/17/23 (!) 142.5 kg    EKG today demonstrates  SR Vent.  rate 69 BPM PR interval 188 ms QRS duration 86 ms QT/QTcB 404/432 ms   Echo 12/02/22 1. Left ventricular ejection fraction, by estimation, is 55 to 60%. The  left ventricle has normal function. The left ventricle has no regional  wall motion abnormalities. There is mild concentric left ventricular  hypertrophy. Left ventricular diastolic parameters are indeterminate.   2. Right ventricular systolic  function is normal. The right ventricular  size is normal.   3. The mitral valve is normal in structure. No evidence of mitral valve  regurgitation. No evidence of mitral stenosis.   4. The aortic valve was not well visualized. Aortic valve regurgitation  is not visualized.   Comparison(s): No prior Echocardiogram.    Epic records are reviewed at length today  CHA2DS2-VASc Score = 0  The patient's score is based upon: CHF History: 0 HTN History: 0 Diabetes History: 0 Stroke History: 0 Vascular Disease History: 0 Age Score: 0 Gender Score: 0       ASSESSMENT AND PLAN: Paroxysmal Atrial Fibrillation/atrial flutter The patient's CHA2DS2-VASc score is 0, indicating a 0.2% annual risk of stroke.   Patient appears to be maintaining SR the majority of the time.  Continue flecainide 150 mg BID. If he fails to stay in SR, will need to consider alternate AAD. BMI is prohibitive for ablation.  Continue Lopressor 25 mg BID  Patient not currently taking anticoagulation with low CV score. He does have an Rx of Eliquis on hand if he becomes persistent. Suspect untreated severe OSA contributing.  Apple Watch for home monitoring.   Obesity Body mass index is 51.68 kg/m.  Encouraged lifestyle modification  OSA Severe on study 12/06/22 Will refer him to [facility] sleep for management.    Follow up in the AF clinic in 6 months.    [name] PA-C Afib Clinic [facility] [address] [place] [address] [place], [place] [address] [phone] 06/09/2023 9:24 AM

## 2023-06-10 ENCOUNTER — Telehealth (HOSPITAL_COMMUNITY): Payer: Self-pay | Admitting: *Deleted

## 2023-06-10 NOTE — Telephone Encounter (Signed)
Pt requested new sleep medicine physician - referral placed for [facility]. Appt made for 06/30/23.

## 2023-06-30 ENCOUNTER — Other Ambulatory Visit (HOSPITAL_COMMUNITY): Payer: Self-pay

## 2023-06-30 MED ORDER — FLECAINIDE ACETATE 150 MG PO TABS: 150.0000 mg | ORAL_TABLET | Freq: Two times a day (BID) | ORAL | 3 refills | Status: DC

## 2023-07-14 ENCOUNTER — Emergency Department (HOSPITAL_COMMUNITY): Payer: BC Managed Care – PPO

## 2023-07-14 ENCOUNTER — Emergency Department (HOSPITAL_COMMUNITY)
Admission: EM | Admit: 2023-07-14 | Discharge: 2023-07-14 | Disposition: A | Discharge status: Home / Self Care | Payer: BC Managed Care – PPO | Attending: Student | Admitting: Student

## 2023-07-14 ENCOUNTER — Encounter (HOSPITAL_COMMUNITY): Payer: Self-pay

## 2023-07-14 ENCOUNTER — Other Ambulatory Visit: Payer: Self-pay

## 2023-07-14 DIAGNOSIS — Z7901 Long term (current) use of anticoagulants: Secondary | ICD-10-CM | POA: Diagnosis not present

## 2023-07-14 DIAGNOSIS — R1013 Epigastric pain: Secondary | ICD-10-CM | POA: Diagnosis not present

## 2023-07-14 DIAGNOSIS — R079 Chest pain, unspecified: Secondary | ICD-10-CM | POA: Insufficient documentation

## 2023-07-14 DIAGNOSIS — R1011 Right upper quadrant pain: Secondary | ICD-10-CM | POA: Insufficient documentation

## 2023-07-14 DIAGNOSIS — K8020 Calculus of gallbladder without cholecystitis without obstruction: Secondary | ICD-10-CM

## 2023-07-14 LAB — CBC
HCT: 50.6 % (ref 39.0–52.0)
Hemoglobin: 15.7 g/dL (ref 13.0–17.0)
MCH: 27.4 pg (ref 26.0–34.0)
MCHC: 31 g/dL (ref 30.0–36.0)
MCV: 88.3 fL (ref 80.0–100.0)
Platelets: 241 10*3/uL (ref 150–400)
RBC: 5.73 MIL/uL (ref 4.22–5.81)
RDW: 14.1 % (ref 11.5–15.5)
WBC: 9.6 10*3/uL (ref 4.0–10.5)
nRBC: 0 % (ref 0.0–0.2)

## 2023-07-14 LAB — BASIC METABOLIC PANEL
Anion gap: 9 (ref 5–15)
BUN: 18 mg/dL (ref 6–20)
CO2: 26 mmol/L (ref 22–32)
Calcium: 8.7 mg/dL — ABNORMAL LOW (ref 8.9–10.3)
Chloride: 104 mmol/L (ref 98–111)
Creatinine, Ser: 1.27 mg/dL — ABNORMAL HIGH (ref 0.61–1.24)
GFR, Estimated: >60 mL/min (ref >60)
Glucose, Bld: 140 mg/dL — ABNORMAL HIGH (ref 70–99)
Potassium: 3.9 mmol/L (ref 3.5–5.1)
Sodium: 139 mmol/L (ref 135–145)

## 2023-07-14 LAB — HEPATIC FUNCTION PANEL
ALT: 23 U/L (ref 0–44)
AST: 18 U/L (ref 15–41)
Albumin: 3.3 g/dL — ABNORMAL LOW (ref 3.5–5.0)
Alkaline Phosphatase: 77 U/L (ref 38–126)
Bilirubin, Direct: <0.1 mg/dL (ref 0.0–0.2)
Total Bilirubin: 0.2 mg/dL — ABNORMAL LOW (ref 0.3–1.2)
Total Protein: 8.1 g/dL (ref 6.5–8.1)

## 2023-07-14 LAB — TROPONIN I (HIGH SENSITIVITY)
Troponin I (High Sensitivity): 7 ng/L (ref <18)
Troponin I (High Sensitivity): 8 ng/L (ref <18)

## 2023-07-14 LAB — D-DIMER, QUANTITATIVE: D-Dimer, Quant: 0.58 ug{FEU}/mL — ABNORMAL HIGH (ref 0.00–0.50)

## 2023-07-14 MED ORDER — IOHEXOL 350 MG/ML SOLN
75.0000 mL | Freq: Once | INTRAVENOUS | Status: AC | PRN
  Administered 2023-07-14: 75 mL via INTRAVENOUS

## 2023-07-14 MED ORDER — LIDOCAINE VISCOUS HCL 2 % MT SOLN
15.0000 mL | Freq: Once | OROMUCOSAL | Status: AC
  Administered 2023-07-14: 15 mL via ORAL
  Filled 2023-07-14: qty 15

## 2023-07-14 MED ORDER — MORPHINE SULFATE (PF) 4 MG/ML IV SOLN
4.0000 mg | Freq: Once | INTRAVENOUS | Status: AC
  Administered 2023-07-14: 4 mg via INTRAVENOUS
  Filled 2023-07-14: qty 1

## 2023-07-14 MED ORDER — OXYCODONE-ACETAMINOPHEN 5-325 MG PO TABS: 1.0000 | ORAL_TABLET | Freq: Four times a day (QID) | ORAL | 0 refills | Status: DC | PRN

## 2023-07-14 MED ORDER — ALUM & MAG HYDROXIDE-SIMETH 200-200-20 MG/5ML PO SUSP
30.0000 mL | Freq: Once | ORAL | Status: AC
  Administered 2023-07-14: 30 mL via ORAL
  Filled 2023-07-14: qty 30

## 2023-07-14 NOTE — Discharge Instructions (Addendum)
You have been seen and discharged from the emergency department.  Your heart and lung workup were normal.  Your scan showed that you have gallstones.  These can cause pain.  There was no signs of an obstructed or infected gallbladder.  However if you continue to have pain you may need the gallbladder electively taken out.  Call the surgery office to establish an appointment.  You may take Tylenol for pain control and take stronger pain medicine as needed.  There is a small amount of Tylenol mixed in the stronger pain medicine.  Do not mix this medication with alcohol or other sedating medications. Do not drive or do heavy physical activity until you know how this medication affects you.  It may cause drowsiness.  Follow-up with your primary provider for further evaluation and further care. Take home medications as prescribed. If you have any worsening symptoms, uncontrolled pain, high fevers or further concerns for your health please return to an emergency department for further evaluation.

## 2023-07-14 NOTE — ED Notes (Signed)
Patient transported to CT 

## 2023-07-14 NOTE — ED Triage Notes (Signed)
Last night pt began having chest pain that radiates to back. At 0230 pain was worse. Pt unable to sleep due to pain. Chest pain is central and constant. Pt has taken OTC meds and has not had any relief. Describes pain as pressure that is constant. Denies SOB

## 2023-07-14 NOTE — ED Provider Notes (Signed)
Patient signed out to me by previous provider. Please refer to their note for full HPI.  Briefly this is a 47 year old male who presented to the emergency department with chest pain.  It appears that the patient's pain is actually more epigastric/right upper quadrant.  Cardiac workup has been negative with normal troponins.  He has some mild reproducible epigastric pain.  Signed out pending CT scan.  CT scan shows no acute finding outside of cholelithiasis with no findings of acute cholecystitis.  He does not have a white count or any abnormal hepatic panel labs.  Doubt acute cholecystitis.  After dose of pain medicine he feels significantly improved, has been able to eat and drink.  Will plan for outpatient pain control and follow-up with surgery.  Strict return to ED precautions discussed.   Rozelle Logan, DO 07/14/23 1054

## 2023-07-14 NOTE — ED Provider Notes (Signed)
[place] EMERGENCY DEPARTMENT AT [facility] Provider Note  CSN: [id number] Arrival date & time: 07/14/23 0518  Chief Complaint(s) Chest Pain  HPI Bruce Marshall is a 47 y.o. male with PMH A-fib, obesity who presents emergency room for evaluation of chest pain.  Patient states that approximately midnight last night after eating he had right upper quadrant epigastric pain that radiated up into the chest.  He went to sleep and awoke in the middle the night with persistent pressure-like pain in the chest.  Denies associated shortness of breath, abdominal pain, nausea, vomiting, headache, fever or other systemic symptoms.   Past Medical History Past Medical History:  Diagnosis Date   A-fib [facility])    Chest pain    Edema    lower extremitiy   Groin pain    Hypertension    borderline   Patient Active Problem List   Diagnosis Date Noted   Positive colorectal cancer screening using Cologuard test 12/19/2022   Benign neoplasm of transverse colon 12/19/2022   Benign neoplasm of sigmoid colon 12/19/2022   Paroxysmal atrial fibrillation (HCC) 12/09/2022   Morbid obesity (HCC) 12/09/2022   Erythema of lower extremity 12/16/2018   Lower extremity edema 11/29/2018   Redness and swelling of lower leg 11/29/2018   Prediabetes 11/29/2018   Home Medication(s) Prior to Admission medications   Medication Sig Start Date End Date Taking? Authorizing Provider  albuterol (VENTOLIN HFA) 108 (90 Base) MCG/ACT inhaler Inhale into the lungs as needed for wheezing or shortness of breath. 11/18/22   [provider]  apixaban (ELIQUIS) 5 MG TABS tablet Take 1 tablet (5 mg total) by mouth 2 (two) times daily. Patient not taking: Reported on 06/09/2023 02/24/23   Eustace Pen, PA-C  flecainide (TAMBOCOR) 150 MG tablet Take 1 tablet (150 mg total) by mouth 2 (two) times daily. 06/30/23   Fenton, Clint R, PA  furosemide (LASIX) 20 MG tablet Take 1 tablet (20 mg total) by mouth daily as  needed (leg swelling). 12/09/22   Christell Constant, MD  metoprolol tartrate (LOPRESSOR) 25 MG tablet Take 1 tablet (25 mg total) by mouth 2 (two) times daily. 06/09/23   Danice Goltz, PA                                                                                                                                    Past Surgical History Past Surgical History:  Procedure Laterality Date   COLONOSCOPY WITH PROPOFOL N/A 12/19/2022   Procedure: COLONOSCOPY WITH PROPOFOL;  Surgeon: Beverley Fiedler, MD;  Location: [facility] ENDOSCOPY;  Service: Gastroenterology;  Laterality: N/A;   PILONIDAL CYST DRAINAGE     POLYPECTOMY  12/19/2022   Procedure: POLYPECTOMY;  Surgeon: Beverley Fiedler, MD;  Location: [facility] ENDOSCOPY;  Service: Gastroenterology;;   WRIST SURGERY Right    Family History Family History  Problem Relation Age of Onset   Healthy Daughter  Heart attack Maternal Uncle    Cancer Neg Hx    Stroke Neg Hx    Colon cancer Neg Hx    Esophageal cancer Neg Hx     Social History Social History   Tobacco Use   Smoking status: Never   Smokeless tobacco: Never   Tobacco comments:    Never smoke 11/06/22  Vaping Use   Vaping status: Never Used  Substance Use Topics   Alcohol use: Not Currently   Drug use: Not Currently   Allergies Patient has no known allergies.  Review of Systems Review of Systems  Cardiovascular:  Positive for chest pain.    Physical Exam Vital Signs  I have reviewed the triage vital signs BP 123/72   Pulse 71   Temp 97.6 F (36.4 C)   Resp 12   Ht 5\' 6"  (1.676 m)   Wt (!) 147.4 kg   SpO2 100%   BMI 52.46 kg/m   Physical Exam Vitals and nursing note reviewed.  Constitutional:      General: He is not in acute distress.    Appearance: He is well-developed.  HENT:     Head: Normocephalic and atraumatic.  Eyes:     Conjunctiva/sclera: Conjunctivae normal.  Cardiovascular:     Rate and Rhythm: Normal rate and regular rhythm.     Heart sounds: No  murmur heard. Pulmonary:     Effort: Pulmonary effort is normal. No respiratory distress.     Breath sounds: Normal breath sounds.  Abdominal:     Palpations: Abdomen is soft.     Tenderness: There is no abdominal tenderness.  Musculoskeletal:        General: No swelling.     Cervical back: Neck supple.  Skin:    General: Skin is warm and dry.     Capillary Refill: Capillary refill takes less than 2 seconds.  Neurological:     Mental Status: He is alert.  Psychiatric:        Mood and Affect: Mood normal.     ED Results and Treatments Labs (all labs ordered are listed, but only abnormal results are displayed) Labs Reviewed  BASIC METABOLIC PANEL - Abnormal; Notable for the following components:      Result Value   Glucose, Bld 140 (*)    Creatinine, Ser 1.27 (*)    Calcium 8.7 (*)    All other components within normal limits  D-DIMER, QUANTITATIVE - Abnormal; Notable for the following components:   D-Dimer, Quant 0.58 (*)    All other components within normal limits  CBC  HEPATIC FUNCTION PANEL  TROPONIN I (HIGH SENSITIVITY)                                                                                                                          Radiology DG Chest 2 View  Result Date: 07/14/2023 CLINICAL DATA:  47 year old male with chest pain radiating to the back. EXAM: CHEST - 2 VIEW COMPARISON:  Portable chest 10/22/2022 and earlier. FINDINGS: PA and lateral views 0542 hours. Lung volumes and mediastinal contours remain normal. Mild motion artifact on the lateral. Visualized tracheal air column is within normal limits. No pneumothorax, pulmonary edema, pleural effusion or confluent lung opacity. Lung markings appear stable since 2020. No acute osseous abnormality identified. Negative visible bowel gas. IMPRESSION: Negative.  No cardiopulmonary abnormality. Electronically Signed   By: Odessa Fleming M.D.   On: 07/14/2023 05:51    Pertinent labs & imaging results that were  available during my care of the patient were reviewed by me and considered in my medical decision making (see MDM for details).  Medications Ordered in ED Medications  alum & mag hydroxide-simeth (MAALOX/MYLANTA) 200-200-20 MG/5ML suspension 30 mL (30 mLs Oral Given 07/14/23 0620)    And  lidocaine (XYLOCAINE) 2 % viscous mouth solution 15 mL (15 mLs Oral Given 07/14/23 4259)                                                                                                                                     Procedures Procedures  (including critical care time)  Medical Decision Making / ED Course   This patient presents to the ED for concern of chest pain, this involves an extensive number of treatment options, and is a complaint that carries with it a high risk of complications and morbidity.  The differential diagnosis includes ACS, Aortic Dissection, Pneumothorax, Pneumonia, Esophageal Rupture, PE, Tamponade/Pericardial Effusion, pericarditis, esophageal spasm, dysrhythmia, GERD, costochondritis.  MDM: Patient seen emergency room for evaluation of chest pain.  Physical exam largely unremarkable.  Laboratory evaluation with a creatinine of 1.27, high-sensitivity troponin is negative but D-dimer minimally elevated at 0.58.  ECG nonischemic.  Chest x-ray unremarkable.  GI cocktail attempted without improvement of symptoms.  At time of signout, patient pending PE study given elevated D-dimer and persistent chest pain.  Please see provider signout note for continuation of workup.   Additional history obtained:  -External records from outside source obtained and reviewed including: Chart review including previous notes, labs, imaging, consultation notes   Lab Tests: -I ordered, reviewed, and interpreted labs.   The pertinent results include:   Labs Reviewed  BASIC METABOLIC PANEL - Abnormal; Notable for the following components:      Result Value   Glucose, Bld 140 (*)    Creatinine,  Ser 1.27 (*)    Calcium 8.7 (*)    All other components within normal limits  D-DIMER, QUANTITATIVE - Abnormal; Notable for the following components:   D-Dimer, Quant 0.58 (*)    All other components within normal limits  CBC  HEPATIC FUNCTION PANEL  TROPONIN I (HIGH SENSITIVITY)      EKG   EKG Interpretation Date/Time:  Monday July 14 2023 05:30:02 EDT Ventricular Rate:  71 PR Interval:  194 QRS Duration:  90 QT Interval:  412 QTC Calculation: 447 R Axis:  28  Text Interpretation: Normal sinus rhythm Normal ECG No previous ECGs available Confirmed by Anaija Wissink ([id number]) on 07/14/2023 6:53:57 AM         Imaging Studies ordered: I ordered imaging studies including chest x-ray I independently visualized and interpreted imaging. I agree with the radiologist interpretation  CT PE pending   Medicines ordered and prescription drug management: Meds ordered this encounter  Medications   AND Linked Order Group    alum & mag hydroxide-simeth (MAALOX/MYLANTA) 200-200-20 MG/5ML suspension 30 mL    lidocaine (XYLOCAINE) 2 % viscous mouth solution 15 mL    -I have reviewed the patients home medicines and have made adjustments as needed  Critical interventions none   Cardiac Monitoring: The patient was maintained on a cardiac monitor.  I personally viewed and interpreted the cardiac monitored which showed an underlying rhythm of: NSR  Social Determinants of Health:  Factors impacting patients care include: none   Reevaluation: After the interventions noted above, I reevaluated the patient and found that they have :stayed the same  Co morbidities that complicate the patient evaluation  Past Medical History:  Diagnosis Date   A-fib (HCC)    Chest pain    Edema    lower extremitiy   Groin pain    Hypertension    borderline      Dispostion: I considered admission for this patient, and disposition pending completion of imaging studies.  Please see provider  signout note for continuation of workup.     Final Clinical Impression(s) / ED Diagnoses Final diagnoses:  None     @PCDICTATION @    Glendora Score, MD 07/14/23 [phone]

## 2023-07-16 ENCOUNTER — Other Ambulatory Visit: Payer: Self-pay | Admitting: Surgery

## 2023-07-16 DIAGNOSIS — R1011 Right upper quadrant pain: Secondary | ICD-10-CM

## 2023-07-16 DIAGNOSIS — K8020 Calculus of gallbladder without cholecystitis without obstruction: Secondary | ICD-10-CM

## 2023-09-23 ENCOUNTER — Emergency Department (HOSPITAL_COMMUNITY): Payer: 59

## 2023-09-23 ENCOUNTER — Other Ambulatory Visit: Payer: Self-pay

## 2023-09-23 ENCOUNTER — Observation Stay (HOSPITAL_COMMUNITY)
Admission: EM | Admit: 2023-09-23 | Discharge: 2023-09-24 | Disposition: A | Discharge status: Home / Self Care | Payer: 59

## 2023-09-23 ENCOUNTER — Encounter (HOSPITAL_COMMUNITY): Payer: Self-pay | Admitting: Emergency Medicine

## 2023-09-23 ENCOUNTER — Encounter (HOSPITAL_COMMUNITY)
Admission: EM | Disposition: A | Discharge status: Home / Self Care | Payer: Self-pay | Source: Home / Self Care | Attending: Student

## 2023-09-23 ENCOUNTER — Emergency Department (HOSPITAL_COMMUNITY): Payer: 59 | Admitting: Certified Registered Nurse Anesthetist

## 2023-09-23 ENCOUNTER — Emergency Department (HOSPITAL_BASED_OUTPATIENT_CLINIC_OR_DEPARTMENT_OTHER): Payer: 59 | Admitting: Certified Registered Nurse Anesthetist

## 2023-09-23 DIAGNOSIS — K810 Acute cholecystitis: Secondary | ICD-10-CM | POA: Diagnosis not present

## 2023-09-23 DIAGNOSIS — Z7901 Long term (current) use of anticoagulants: Secondary | ICD-10-CM | POA: Insufficient documentation

## 2023-09-23 DIAGNOSIS — K82A1 Gangrene of gallbladder in cholecystitis: Secondary | ICD-10-CM

## 2023-09-23 DIAGNOSIS — K8012 Calculus of gallbladder with acute and chronic cholecystitis without obstruction: Principal | ICD-10-CM | POA: Insufficient documentation

## 2023-09-23 DIAGNOSIS — Z79899 Other long term (current) drug therapy: Secondary | ICD-10-CM | POA: Diagnosis not present

## 2023-09-23 DIAGNOSIS — R1011 Right upper quadrant pain: Secondary | ICD-10-CM | POA: Diagnosis present

## 2023-09-23 DIAGNOSIS — I4891 Unspecified atrial fibrillation: Secondary | ICD-10-CM | POA: Diagnosis not present

## 2023-09-23 DIAGNOSIS — I10 Essential (primary) hypertension: Secondary | ICD-10-CM | POA: Diagnosis not present

## 2023-09-23 HISTORY — PX: CHOLECYSTECTOMY: SHX55

## 2023-09-23 HISTORY — DX: Sleep apnea, unspecified: G47.30

## 2023-09-23 LAB — URINALYSIS, ROUTINE W REFLEX MICROSCOPIC
Bilirubin Urine: NEGATIVE
Glucose, UA: NEGATIVE mg/dL
Hgb urine dipstick: NEGATIVE
Ketones, ur: NEGATIVE mg/dL
Leukocytes,Ua: NEGATIVE
Nitrite: NEGATIVE
Protein, ur: >=300 mg/dL — AB
Specific Gravity, Urine: 1.02 (ref 1.005–1.030)
pH: 5 (ref 5.0–8.0)

## 2023-09-23 LAB — CBC
HCT: 52.1 % — ABNORMAL HIGH (ref 39.0–52.0)
HCT: 50.7 % (ref 39.0–52.0)
Hemoglobin: 16.4 g/dL (ref 13.0–17.0)
Hemoglobin: 16.4 g/dL (ref 13.0–17.0)
MCH: 27.5 pg (ref 26.0–34.0)
MCH: 27.7 pg (ref 26.0–34.0)
MCHC: 31.5 g/dL (ref 30.0–36.0)
MCHC: 32.3 g/dL (ref 30.0–36.0)
MCV: 87.4 fL (ref 80.0–100.0)
MCV: 85.8 fL (ref 80.0–100.0)
Platelets: 255 10*3/uL (ref 150–400)
Platelets: 260 10*3/uL (ref 150–400)
RBC: 5.96 MIL/uL — ABNORMAL HIGH (ref 4.22–5.81)
RBC: 5.91 MIL/uL — ABNORMAL HIGH (ref 4.22–5.81)
RDW: 13.9 % (ref 11.5–15.5)
RDW: 14.3 % (ref 11.5–15.5)
WBC: 11.5 10*3/uL — ABNORMAL HIGH (ref 4.0–10.5)
WBC: 14.7 10*3/uL — ABNORMAL HIGH (ref 4.0–10.5)
nRBC: 0 % (ref 0.0–0.2)
nRBC: 0 % (ref 0.0–0.2)

## 2023-09-23 LAB — COMPREHENSIVE METABOLIC PANEL
ALT: 26 U/L (ref 0–44)
AST: 20 U/L (ref 15–41)
Albumin: 3.4 g/dL — ABNORMAL LOW (ref 3.5–5.0)
Alkaline Phosphatase: 84 U/L (ref 38–126)
Anion gap: 13 (ref 5–15)
BUN: 21 mg/dL — ABNORMAL HIGH (ref 6–20)
CO2: 23 mmol/L (ref 22–32)
Calcium: 9.1 mg/dL (ref 8.9–10.3)
Chloride: 102 mmol/L (ref 98–111)
Creatinine, Ser: 1.07 mg/dL (ref 0.61–1.24)
GFR, Estimated: >60 mL/min (ref >60)
Glucose, Bld: 175 mg/dL — ABNORMAL HIGH (ref 70–99)
Potassium: 4 mmol/L (ref 3.5–5.1)
Sodium: 138 mmol/L (ref 135–145)
Total Bilirubin: 0.5 mg/dL (ref 0.0–1.2)
Total Protein: 8.1 g/dL (ref 6.5–8.1)

## 2023-09-23 LAB — HIV ANTIBODY (ROUTINE TESTING W REFLEX): HIV Screen 4th Generation wRfx: NONREACTIVE

## 2023-09-23 LAB — CREATININE, SERUM
Creatinine, Ser: 1.29 mg/dL — ABNORMAL HIGH (ref 0.61–1.24)
GFR, Estimated: >60 mL/min (ref >60)

## 2023-09-23 LAB — TROPONIN I (HIGH SENSITIVITY)
Troponin I (High Sensitivity): 4 ng/L (ref <18)
Troponin I (High Sensitivity): 4 ng/L (ref <18)

## 2023-09-23 LAB — LIPASE, BLOOD: Lipase: 43 U/L (ref 11–51)

## 2023-09-23 SURGERY — LAPAROSCOPIC CHOLECYSTECTOMY WITH INTRAOPERATIVE CHOLANGIOGRAM
Anesthesia: General | Site: Abdomen

## 2023-09-23 MED ORDER — SODIUM CHLORIDE 0.9 % IV SOLN
2.0000 g | INTRAVENOUS | Status: AC
  Administered 2023-09-23: 2 g via INTRAVENOUS
  Filled 2023-09-23: qty 20

## 2023-09-23 MED ORDER — DEXMEDETOMIDINE HCL IN NACL 80 MCG/20ML IV SOLN
INTRAVENOUS | Status: AC
  Filled 2023-09-23: qty 20

## 2023-09-23 MED ORDER — ONDANSETRON HCL 4 MG/2ML IJ SOLN
4.0000 mg | Freq: Once | INTRAMUSCULAR | Status: AC
  Administered 2023-09-23: 4 mg via INTRAVENOUS
  Filled 2023-09-23: qty 2

## 2023-09-23 MED ORDER — ONDANSETRON HCL 4 MG/2ML IJ SOLN
INTRAMUSCULAR | Status: AC
  Filled 2023-09-23: qty 2

## 2023-09-23 MED ORDER — FENTANYL CITRATE (PF) 250 MCG/5ML IJ SOLN
INTRAMUSCULAR | Status: DC | PRN
  Administered 2023-09-23: 50 ug via INTRAVENOUS
  Administered 2023-09-23: 100 ug via INTRAVENOUS
  Administered 2023-09-23: 50 ug via INTRAVENOUS

## 2023-09-23 MED ORDER — BUPIVACAINE-EPINEPHRINE (PF) 0.25% -1:200000 IJ SOLN
INTRAMUSCULAR | Status: AC
  Filled 2023-09-23: qty 30

## 2023-09-23 MED ORDER — DEXAMETHASONE SODIUM PHOSPHATE 10 MG/ML IJ SOLN
INTRAMUSCULAR | Status: DC | PRN
  Administered 2023-09-23: 10 mg via INTRAVENOUS

## 2023-09-23 MED ORDER — DEXAMETHASONE SODIUM PHOSPHATE 10 MG/ML IJ SOLN
INTRAMUSCULAR | Status: AC
  Filled 2023-09-23: qty 1

## 2023-09-23 MED ORDER — PHENYLEPHRINE 80 MCG/ML (10ML) SYRINGE FOR IV PUSH (FOR BLOOD PRESSURE SUPPORT)
PREFILLED_SYRINGE | INTRAVENOUS | Status: DC | PRN
  Administered 2023-09-23 (×2): 80 ug via INTRAVENOUS
  Administered 2023-09-23: 160 ug via INTRAVENOUS

## 2023-09-23 MED ORDER — MIDAZOLAM HCL 2 MG/2ML IJ SOLN
INTRAMUSCULAR | Status: AC
  Filled 2023-09-23: qty 2

## 2023-09-23 MED ORDER — HYDROMORPHONE HCL 1 MG/ML IJ SOLN
0.5000 mg | Freq: Once | INTRAMUSCULAR | Status: AC
  Administered 2023-09-23: 0.5 mg via INTRAVENOUS
  Filled 2023-09-23: qty 1

## 2023-09-23 MED ORDER — OXYCODONE HCL 5 MG PO TABS
5.0000 mg | ORAL_TABLET | ORAL | Status: DC | PRN
  Administered 2023-09-23 – 2023-09-24 (×2): 10 mg via ORAL
  Administered 2023-09-24: 5 mg via ORAL
  Filled 2023-09-23: qty 2
  Filled 2023-09-23: qty 1
  Filled 2023-09-23: qty 2

## 2023-09-23 MED ORDER — LACTATED RINGERS IV SOLN: INTRAVENOUS | Status: DC

## 2023-09-23 MED ORDER — METOPROLOL TARTRATE 25 MG PO TABS
25.0000 mg | ORAL_TABLET | Freq: Two times a day (BID) | ORAL | Status: DC
  Administered 2023-09-23 – 2023-09-24 (×2): 25 mg via ORAL
  Filled 2023-09-23 (×2): qty 1

## 2023-09-23 MED ORDER — ROCURONIUM BROMIDE 10 MG/ML (PF) SYRINGE
PREFILLED_SYRINGE | INTRAVENOUS | Status: DC | PRN
  Administered 2023-09-23: 30 mg via INTRAVENOUS
  Administered 2023-09-23: 10 mg via INTRAVENOUS
  Administered 2023-09-23: 50 mg via INTRAVENOUS

## 2023-09-23 MED ORDER — ONDANSETRON HCL 4 MG/2ML IJ SOLN: 4.0000 mg | Freq: Once | INTRAMUSCULAR | Status: DC | PRN

## 2023-09-23 MED ORDER — HYDROMORPHONE HCL 1 MG/ML IJ SOLN
0.5000 mg | Freq: Once | INTRAMUSCULAR | Status: AC
Start: 2023-09-23 — End: 2023-09-23
  Administered 2023-09-23: 0.5 mg via INTRAVENOUS
  Filled 2023-09-23: qty 1

## 2023-09-23 MED ORDER — FENTANYL CITRATE (PF) 250 MCG/5ML IJ SOLN
INTRAMUSCULAR | Status: AC
  Filled 2023-09-23: qty 5

## 2023-09-23 MED ORDER — DEXMEDETOMIDINE HCL IN NACL 80 MCG/20ML IV SOLN
INTRAVENOUS | Status: DC | PRN
  Administered 2023-09-23: 12 ug via INTRAVENOUS

## 2023-09-23 MED ORDER — EPHEDRINE SULFATE-NACL 50-0.9 MG/10ML-% IV SOSY
PREFILLED_SYRINGE | INTRAVENOUS | Status: DC | PRN
  Administered 2023-09-23: 10 mg via INTRAVENOUS

## 2023-09-23 MED ORDER — INDOCYANINE GREEN 25 MG IV SOLR
1.2500 mg | INTRAVENOUS | Status: DC
  Filled 2023-09-23: qty 10

## 2023-09-23 MED ORDER — PROPOFOL 10 MG/ML IV BOLUS
INTRAVENOUS | Status: AC
  Filled 2023-09-23: qty 20

## 2023-09-23 MED ORDER — SODIUM CHLORIDE 0.9 % IR SOLN
Status: DC | PRN
  Administered 2023-09-23: 1000 mL

## 2023-09-23 MED ORDER — ROCURONIUM BROMIDE 10 MG/ML (PF) SYRINGE
PREFILLED_SYRINGE | INTRAVENOUS | Status: AC
  Filled 2023-09-23: qty 10

## 2023-09-23 MED ORDER — FLECAINIDE ACETATE 50 MG PO TABS
150.0000 mg | ORAL_TABLET | Freq: Two times a day (BID) | ORAL | Status: DC
  Administered 2023-09-23 – 2023-09-24 (×2): 150 mg via ORAL
  Filled 2023-09-23 (×3): qty 1

## 2023-09-23 MED ORDER — SUGAMMADEX SODIUM 200 MG/2ML IV SOLN
INTRAVENOUS | Status: DC | PRN
  Administered 2023-09-23: 200 mg via INTRAVENOUS

## 2023-09-23 MED ORDER — SUCCINYLCHOLINE CHLORIDE 200 MG/10ML IV SOSY
PREFILLED_SYRINGE | INTRAVENOUS | Status: DC | PRN
  Administered 2023-09-23: 140 mg via INTRAVENOUS

## 2023-09-23 MED ORDER — ACETAMINOPHEN 500 MG PO TABS
1000.0000 mg | ORAL_TABLET | Freq: Four times a day (QID) | ORAL | Status: DC
  Administered 2023-09-23 – 2023-09-24 (×4): 1000 mg via ORAL
  Filled 2023-09-23 (×4): qty 2

## 2023-09-23 MED ORDER — HYDROMORPHONE HCL 1 MG/ML IJ SOLN: 0.2500 mg | INTRAMUSCULAR | Status: DC | PRN

## 2023-09-23 MED ORDER — KETOROLAC TROMETHAMINE 30 MG/ML IJ SOLN: 30.0000 mg | Freq: Once | INTRAMUSCULAR | Status: DC | PRN

## 2023-09-23 MED ORDER — PHENYLEPHRINE 80 MCG/ML (10ML) SYRINGE FOR IV PUSH (FOR BLOOD PRESSURE SUPPORT)
PREFILLED_SYRINGE | INTRAVENOUS | Status: AC
  Filled 2023-09-23: qty 10

## 2023-09-23 MED ORDER — LIDOCAINE 2% (20 MG/ML) 5 ML SYRINGE
INTRAMUSCULAR | Status: DC | PRN
  Administered 2023-09-23: 60 mg via INTRAVENOUS

## 2023-09-23 MED ORDER — BUPIVACAINE-EPINEPHRINE 0.25% -1:200000 IJ SOLN
INTRAMUSCULAR | Status: DC | PRN
  Administered 2023-09-23: 26 mL

## 2023-09-23 MED ORDER — SUCCINYLCHOLINE CHLORIDE 200 MG/10ML IV SOSY
PREFILLED_SYRINGE | INTRAVENOUS | Status: AC
  Filled 2023-09-23: qty 10

## 2023-09-23 MED ORDER — ENOXAPARIN SODIUM 40 MG/0.4ML IJ SOSY
40.0000 mg | PREFILLED_SYRINGE | INTRAMUSCULAR | Status: DC
  Administered 2023-09-24: 40 mg via SUBCUTANEOUS
  Filled 2023-09-23: qty 0.4

## 2023-09-23 MED ORDER — 0.9 % SODIUM CHLORIDE (POUR BTL) OPTIME
TOPICAL | Status: DC | PRN
  Administered 2023-09-23: 1000 mL

## 2023-09-23 MED ORDER — OXYCODONE HCL 5 MG PO TABS: 5.0000 mg | ORAL_TABLET | Freq: Once | ORAL | Status: DC | PRN

## 2023-09-23 MED ORDER — PROPOFOL 10 MG/ML IV BOLUS
INTRAVENOUS | Status: DC | PRN
  Administered 2023-09-23: 200 mg via INTRAVENOUS

## 2023-09-23 MED ORDER — CHLORHEXIDINE GLUCONATE 0.12 % MT SOLN: 15.0000 mL | Freq: Once | OROMUCOSAL | Status: AC

## 2023-09-23 MED ORDER — MORPHINE SULFATE (PF) 4 MG/ML IV SOLN
4.0000 mg | Freq: Once | INTRAVENOUS | Status: AC
  Administered 2023-09-23: 4 mg via INTRAVENOUS
  Filled 2023-09-23: qty 1

## 2023-09-23 MED ORDER — DEXMEDETOMIDINE HCL IN NACL 80 MCG/20ML IV SOLN: INTRAVENOUS | Status: DC | PRN

## 2023-09-23 MED ORDER — IOHEXOL 350 MG/ML SOLN
100.0000 mL | Freq: Once | INTRAVENOUS | Status: AC | PRN
  Administered 2023-09-23: 100 mL via INTRAVENOUS

## 2023-09-23 MED ORDER — ACETAMINOPHEN 500 MG PO TABS
1000.0000 mg | ORAL_TABLET | ORAL | Status: AC
  Administered 2023-09-23: 1000 mg via ORAL
  Filled 2023-09-23: qty 2

## 2023-09-23 MED ORDER — INDOCYANINE GREEN 25 MG IV SOLR
2.5000 mg | INTRAVENOUS | Status: AC
  Administered 2023-09-23: 2.5 mg via INTRAVENOUS
  Filled 2023-09-23: qty 10

## 2023-09-23 MED ORDER — CHLORHEXIDINE GLUCONATE 0.12 % MT SOLN
OROMUCOSAL | Status: AC
  Administered 2023-09-23: 15 mL via OROMUCOSAL
  Filled 2023-09-23: qty 15

## 2023-09-23 MED ORDER — HYDROMORPHONE HCL 1 MG/ML IJ SOLN: 0.5000 mg | INTRAMUSCULAR | Status: DC | PRN

## 2023-09-23 MED ORDER — MIDAZOLAM HCL 5 MG/5ML IJ SOLN
INTRAMUSCULAR | Status: DC | PRN
  Administered 2023-09-23: 2 mg via INTRAVENOUS

## 2023-09-23 MED ORDER — ONDANSETRON HCL 4 MG/2ML IJ SOLN
4.0000 mg | INTRAMUSCULAR | Status: DC | PRN
  Administered 2023-09-23: 4 mg via INTRAVENOUS

## 2023-09-23 MED ORDER — ORAL CARE MOUTH RINSE: 15.0000 mL | Freq: Once | OROMUCOSAL | Status: AC

## 2023-09-23 MED ORDER — METOPROLOL TARTRATE 25 MG PO TABS
25.0000 mg | ORAL_TABLET | Freq: Once | ORAL | Status: AC
  Administered 2023-09-23: 25 mg via ORAL
  Filled 2023-09-23: qty 1

## 2023-09-23 MED ORDER — PROPOFOL 10 MG/ML IV BOLUS
INTRAVENOUS | Status: AC
Start: 2023-09-23 — End: ?
  Filled 2023-09-23: qty 20

## 2023-09-23 MED ORDER — OXYCODONE HCL 5 MG/5ML PO SOLN: 5.0000 mg | Freq: Once | ORAL | Status: DC | PRN

## 2023-09-23 SURGICAL SUPPLY — 52 items
APPLIER CLIP ROT 10 11.4 M/L (STAPLE) ×2
BAG COUNTER SPONGE SURGICOUNT (BAG) ×2 IMPLANT
BIOPATCH RED 1 DISK 7.0 (GAUZE/BANDAGES/DRESSINGS) IMPLANT
CANISTER SUCT 3000ML PPV (MISCELLANEOUS) ×2 IMPLANT
CATH URETL OPEN END 6FR 70 (CATHETERS) IMPLANT
CHLORAPREP W/TINT 26 (MISCELLANEOUS) ×2 IMPLANT
CLIP APPLIE ROT 10 11.4 M/L (STAPLE) ×2 IMPLANT
COVER MAYO STAND STRL (DRAPES) ×2 IMPLANT
COVER SURGICAL LIGHT HANDLE (MISCELLANEOUS) ×2 IMPLANT
DERMABOND ADVANCED .7 DNX12 (GAUZE/BANDAGES/DRESSINGS) ×2 IMPLANT
DRAIN CHANNEL 19F RND (DRAIN) IMPLANT
DRAPE C-ARM 42X120 X-RAY (DRAPES) ×2 IMPLANT
DRSG IV TEGADERM 3.5X4.5 STRL (GAUZE/BANDAGES/DRESSINGS) IMPLANT
ELECT REM PT RETURN 9FT ADLT (ELECTROSURGICAL) ×2
ELECTRODE REM PT RTRN 9FT ADLT (ELECTROSURGICAL) ×2 IMPLANT
ENDOLOOP SUT PDS II 0 18 (SUTURE) IMPLANT
EVACUATOR SILICONE 100CC (DRAIN) IMPLANT
GLOVE BIO SURGEON STRL SZ7 (GLOVE) ×2 IMPLANT
GOWN STRL REUS W/ TWL LRG LVL3 (GOWN DISPOSABLE) ×4 IMPLANT
GOWN STRL REUS W/ TWL XL LVL3 (GOWN DISPOSABLE) ×2 IMPLANT
GRASPER SUT TROCAR 14GX15 (MISCELLANEOUS) ×2 IMPLANT
IRRIG SUCT STRYKERFLOW 2 WTIP (MISCELLANEOUS) ×2
IRRIGATION SUCT STRKRFLW 2 WTP (MISCELLANEOUS) ×2 IMPLANT
KIT BASIN OR (CUSTOM PROCEDURE TRAY) ×2 IMPLANT
KIT IMAGING PINPOINTPAQ (MISCELLANEOUS) IMPLANT
KIT TURNOVER KIT B (KITS) ×2 IMPLANT
L-HOOK LAP DISP 36CM (ELECTROSURGICAL) ×2
LHOOK LAP DISP 36CM (ELECTROSURGICAL) ×2 IMPLANT
NDL 22X1.5 STRL (OR ONLY) (MISCELLANEOUS) ×2 IMPLANT
NDL INSUFFLATION 14GA 120MM (NEEDLE) ×2 IMPLANT
NEEDLE 22X1.5 STRL (OR ONLY) (MISCELLANEOUS) ×2 IMPLANT
NEEDLE INSUFFLATION 14GA 120MM (NEEDLE) ×2 IMPLANT
NS IRRIG 1000ML POUR BTL (IV SOLUTION) ×2 IMPLANT
PAD ARMBOARD 7.5X6 YLW CONV (MISCELLANEOUS) ×2 IMPLANT
PENCIL BUTTON HOLSTER BLD 10FT (ELECTRODE) ×2 IMPLANT
POUCH RETRIEVAL ECOSAC 10 (ENDOMECHANICALS) ×2 IMPLANT
SCISSORS LAP 5X35 DISP (ENDOMECHANICALS) ×2 IMPLANT
SET CHOLANGIOGRAPH 5 50 .035 (SET/KITS/TRAYS/PACK) ×2 IMPLANT
SET TUBE SMOKE EVAC HIGH FLOW (TUBING) ×2 IMPLANT
SLEEVE Z-THREAD 5X100MM (TROCAR) ×4 IMPLANT
SPECIMEN JAR SMALL (MISCELLANEOUS) ×2 IMPLANT
SPONGE DRAIN TRACH 4X4 STRL 2S (GAUZE/BANDAGES/DRESSINGS) IMPLANT
STOPCOCK 4 WAY LG BORE MALE ST (IV SETS) IMPLANT
SUT ETHILON 2 0 FS 18 (SUTURE) IMPLANT
SUT MNCRL AB 4-0 PS2 18 (SUTURE) ×2 IMPLANT
TOWEL GREEN STERILE (TOWEL DISPOSABLE) ×2 IMPLANT
TOWEL GREEN STERILE FF (TOWEL DISPOSABLE) ×2 IMPLANT
TRAY LAPAROSCOPIC MC (CUSTOM PROCEDURE TRAY) ×2 IMPLANT
TROCAR Z THREAD OPTICAL 12X100 (TROCAR) ×2 IMPLANT
TROCAR Z-THREAD OPTICAL 5X100M (TROCAR) ×2 IMPLANT
WARMER LAPAROSCOPE (MISCELLANEOUS) ×2 IMPLANT
WATER STERILE IRR 1000ML POUR (IV SOLUTION) ×2 IMPLANT

## 2023-09-23 NOTE — Anesthesia Preprocedure Evaluation (Signed)
 Anesthesia Evaluation  Patient identified by MRN, date of birth, ID band Patient awake    Reviewed: Allergy & Precautions, NPO status , Patient's Chart, lab work & pertinent test results  Airway Mallampati: III  TM Distance: >3 FB Neck ROM: Full    Dental no notable dental hx. (+) Teeth Intact, Dental Advisory Given   Pulmonary sleep apnea    Pulmonary exam normal breath sounds clear to auscultation       Cardiovascular hypertension, Pt. on medications Normal cardiovascular exam+ dysrhythmias  Rhythm:Regular Rate:Normal     Neuro/Psych negative neurological ROS     GI/Hepatic negative GI ROS, Neg liver ROS,,,  Endo/Other    Class 4 obesity  Renal/GU negative Renal ROSLab Results      Component                Value               Date                      NA                       138                 09/23/2023                CL                       102                 09/23/2023                K                        4.0                 09/23/2023                CO2                      23                  09/23/2023                BUN                      21 (H)              09/23/2023                CREATININE               1.07                09/23/2023                GFRNONAA                 >60                 09/23/2023                CALCIUM                  9.1                 09/23/2023  ALBUMIN                  3.4 (L)             09/23/2023                GLUCOSE                  175 (H)             09/23/2023                Musculoskeletal negative musculoskeletal ROS (+)    Abdominal   Peds  Hematology Lab Results      Component                Value               Date                      WBC                      11.5 (H)            09/23/2023                HGB                      16.4                09/23/2023                HCT                      52.1 (H)            09/23/2023                 MCV                      87.4                09/23/2023                PLT                      255                 09/23/2023              Anesthesia Other Findings   Reproductive/Obstetrics                              Anesthesia Physical Anesthesia Plan  ASA: 3  Anesthesia Plan: General   Post-op Pain Management: Precedex  and Tylenol  PO (pre-op)*   Induction: Intravenous  PONV Risk Score and Plan: 3 and Treatment may vary due to age or medical condition, Midazolam  and Ondansetron   Airway Management Planned: Oral ETT  Additional Equipment: None  Intra-op Plan:   Post-operative Plan: Extubation in OR  Informed Consent: I have reviewed the patients History and Physical, chart, labs and discussed the procedure including the risks, benefits and alternatives for the proposed anesthesia with the patient or authorized representative who has indicated his/her understanding and acceptance.     Dental advisory given  Plan Discussed with:   Anesthesia Plan Comments:          Anesthesia  Quick Evaluation

## 2023-09-23 NOTE — Transfer of Care (Signed)
 Immediate Anesthesia Transfer of Care Note  Patient: Bruce Marshall  Procedure(s) Performed: LAPAROSCOPIC CHOLECYSTECTOMY INDOCYANINE GREEN  FLUORESCENCE IMAGING (ICG) (Abdomen)  Patient Location: PACU  Anesthesia Type:General  Level of Consciousness: awake  Airway & Oxygen Therapy: Patient connected to face mask oxygen  Post-op Assessment: Report given to RN  Post vital signs: stable  Last Vitals:  Vitals Value Taken Time  BP 117/67 09/23/23 1636  Temp 37.2 C 09/23/23 1634  Pulse 83 09/23/23 1642  Resp 28 09/23/23 1642  SpO2 90 % 09/23/23 1642  Vitals shown include unfiled device data.  Last Pain:  Vitals:   09/23/23 1634  TempSrc:   PainSc: Asleep      Patients Stated Pain Goal: 3 (09/23/23 9061)  Complications: No notable events documented.

## 2023-09-23 NOTE — Anesthesia Procedure Notes (Signed)
 Procedure Name: Intubation Date/Time: 09/23/2023 2:35 PM  Performed by: Moishe Reyes CROME, CRNAPre-anesthesia Checklist: Patient identified, Emergency Drugs available, Suction available, Patient being monitored and Timeout performed Patient Re-evaluated:Patient Re-evaluated prior to induction Oxygen Delivery Method: Circle system utilized Preoxygenation: Pre-oxygenation with 100% oxygen Induction Type: IV induction and Rapid sequence Laryngoscope Size: Glidescope and 4 Grade View: Grade I Tube type: Oral Tube size: 7.5 mm Number of attempts: 1 Airway Equipment and Method: Stylet and Video-laryngoscopy Placement Confirmation: ETT inserted through vocal cords under direct vision, positive ETCO2, breath sounds checked- equal and bilateral and CO2 detector Secured at: 23 cm Dental Injury: Teeth and Oropharynx as per pre-operative assessment

## 2023-09-23 NOTE — ED Triage Notes (Signed)
 Pt here nausea and generalized abd pain. States he thinks is having a gallbladder attack. Reports taking 1 Percocet at midnight with no relief. States he ate butter bread this evening with dinner. Seen for the same 2 months ago, states this pain is worse than previous visit.

## 2023-09-23 NOTE — Discharge Instructions (Signed)

## 2023-09-23 NOTE — ED Provider Notes (Signed)
 Care assumed from [facility], PA-C at shift change pending CT abdomen.  See her note for full HPI.  In short patient presents to the ED due to upper abdominal pain that started after eating.  Previous history of biliary colic.  No chest pain or shortness of breath.  Pending CT, if normal get RUQ US  Physical Exam  BP 137/74   Pulse 79   Temp 98.4 F (36.9 C) (Oral)   Resp (!) 21   Ht 5' 6 (1.676 m)   Wt (!) 147.4 kg   SpO2 95%   BMI 52.45 kg/m   Physical Exam Vitals and nursing note reviewed.  Constitutional:      General: He is not in acute distress.    Appearance: He is not ill-appearing.  HENT:     Head: Normocephalic.  Eyes:     Pupils: Pupils are equal, round, and reactive to light.  Cardiovascular:     Rate and Rhythm: Normal rate and regular rhythm.     Pulses: Normal pulses.     Heart sounds: Normal heart sounds. No murmur heard.    No friction rub. No gallop.  Pulmonary:     Effort: Pulmonary effort is normal.     Breath sounds: Normal breath sounds.  Abdominal:     General: Abdomen is flat. There is no distension.     Palpations: Abdomen is soft.     Tenderness: There is abdominal tenderness. There is no guarding or rebound.     Comments: Upper abdominal tenderness  Musculoskeletal:        General: Normal range of motion.     Cervical back: Neck supple.  Skin:    General: Skin is warm and dry.  Neurological:     General: No focal deficit present.     Mental Status: He is alert.  Psychiatric:        Mood and Affect: Mood normal.        Behavior: Behavior normal.     Procedures  Procedures  ED Course / MDM    Medical Decision Making Amount and/or Complexity of Data Reviewed External Data Reviewed: notes.    Details: General surgery note Labs: ordered. Decision-making details documented in ED Course. Radiology: ordered and independent interpretation performed. Decision-making details documented in ED Course.  Risk Prescription drug  management. Decision regarding hospitalization.   CT abdomen personally reviewed and interpreted which does demonstrate some gallstones with slightly dilated gallbladder.  Right upper quadrant ultrasound ordered.  8:14 AM reassessed patient at bedside.  Patient still admits to significant amount of pain.  Right upper quadrant ultrasound with possible acute cholecystitis.  Will discuss with general surgery for further recommendations.  8:22 AM discussed with Burnard, PA-C with general surgery who recommends admission to medicine.  Plan for OR either tomorrow versus Thursday pending OR schedule and Eliquis .  Hold Eliquis .  8:34 AM Discussed home medications with patient. He actually is not taking Eliquis  or Lasix . General surgery informed. Discussed with Dr. Arlice with [facility] who recommends general surgery admission given surgical need.  9:06 AM Discussed with Dr. Polly with general surgery who agrees to admit patient.    Audrianna Driskill C, PA-C 09/23/23 9090    Albertina Dixon, MD 09/23/23 2139

## 2023-09-23 NOTE — ED Provider Notes (Signed)
  EMERGENCY DEPARTMENT AT [facility] Provider Note   CSN: [id number] Arrival date & time: 09/23/23  9766     History Chief Complaint  Patient presents with   Abdominal Pain    Bruce Marshall is a 48 y.o. male with history of A-fib presents emerged from today for evaluation of upper abdominal pain since 2200 last night.  Patient reports that he was eating some better bread earlier for dinner and then developed some upper abdominal pain.  He reports this feels similar in location but worse in pain than his previous gallbladder attack.  Reports 1 episode of emesis and some nausea.  Denies any diarrhea constipation.  Reports increase in belching.  He still passing gas.  No fever.  No coffee-ground or bloody emesis.  He took a Percocet prior to coming in here reports minimal improvement of pain.  He has seen a surgeon about getting his gallbladder removed in October and it was recommended to be removed however he did not schedule the removal.  He denies any allergies to any medications.  Denies any tobacco, EtOH illicit, drug use.   Abdominal Pain Associated symptoms: nausea and vomiting   Associated symptoms: no chest pain, no chills, no constipation, no diarrhea, no dysuria, no fever, no hematuria and no shortness of breath        Home Medications Prior to Admission medications   Medication Sig Start Date End Date Taking? Authorizing Provider  albuterol (VENTOLIN HFA) 108 (90 Base) MCG/ACT inhaler Inhale into the lungs as needed for wheezing or shortness of breath. 11/18/22   [provider]  apixaban  (ELIQUIS ) 5 MG TABS tablet Take 1 tablet (5 mg total) by mouth 2 (two) times daily. Patient not taking: Reported on 06/09/2023 02/24/23   Bruce Fairy PARAS, Marshall-C  flecainide  (TAMBOCOR ) 150 MG tablet Take 1 tablet (150 mg total) by mouth 2 (two) times daily. 06/30/23   Bruce Marshall  furosemide  (LASIX ) 20 MG tablet Take 1 tablet (20 mg total) by mouth daily as  needed (leg swelling). 12/09/22   Bruce Stanly LABOR, MD  metoprolol  tartrate (LOPRESSOR ) 25 MG tablet Take 1 tablet (25 mg total) by mouth 2 (two) times daily. 06/09/23   Bruce Marshall  oxyCODONE -acetaminophen  (PERCOCET/ROXICET) 5-325 MG tablet Take 1 tablet by mouth every 6 (six) hours as needed for severe pain (pain score 7-10). 07/14/23   Bruce Marshall      Allergies    Patient has no known allergies.    Review of Systems   Review of Systems  Constitutional:  Negative for chills and fever.  Respiratory:  Negative for shortness of breath.   Cardiovascular:  Negative for chest pain.  Gastrointestinal:  Positive for abdominal pain, nausea and vomiting. Negative for blood in stool, constipation and diarrhea.  Genitourinary:  Negative for dysuria and hematuria.    Physical Exam Updated Vital Signs BP (!) 143/87 (BP Location: Right Arm)   Pulse 75   Temp 98.4 F (36.9 C) (Oral)   Resp (!) 22   Ht 5' 6 (1.676 m)   Wt (!) 147.4 kg   SpO2 97%   BMI 52.45 kg/m  Physical Exam Vitals and nursing note reviewed.  Constitutional:      Appearance: He is not toxic-appearing.     Comments: Uncomfortable  Cardiovascular:     Rate and Rhythm: Normal rate.  Pulmonary:     Effort: Pulmonary effort is normal.     Breath sounds: Normal  breath sounds.  Abdominal:     General: Bowel sounds are normal.     Palpations: Abdomen is soft.     Tenderness: There is abdominal tenderness. There is no guarding or rebound.     Comments: Generalized abdominal tenderness but mainly in the upper quadrants.  Soft.  No guarding or rebound.  Skin:    General: Skin is warm and dry.  Neurological:     Mental Status: He is alert.     ED Results / Procedures / Treatments   Labs (all labs ordered are listed, but only abnormal results are displayed) Labs Reviewed  COMPREHENSIVE METABOLIC PANEL - Abnormal; Notable for the following components:      Result Value   Glucose, Bld 175 (*)     BUN 21 (*)    Albumin 3.4 (*)    All other components within normal limits  CBC - Abnormal; Notable for the following components:   WBC 11.5 (*)    RBC 5.96 (*)    HCT 52.1 (*)    All other components within normal limits  URINALYSIS, ROUTINE W REFLEX MICROSCOPIC - Abnormal; Notable for the following components:   APPearance HAZY (*)    Protein, ur >=300 (*)    Bacteria, UA RARE (*)    All other components within normal limits  LIPASE, BLOOD  TROPONIN I (HIGH SENSITIVITY)    EKG None  Radiology No results found.  Procedures Procedures   Medications Ordered in ED Medications  ondansetron  (ZOFRAN ) injection 4 mg (has no administration in time range)  morphine  (PF) 4 MG/ML injection 4 mg (has no administration in time range)    ED Course/ Medical Decision Making/ A&P   Medical Decision Making Amount and/or Complexity of Data Reviewed Labs: ordered. Radiology: ordered.  Risk Prescription drug management.   48 y.o. male presents to the ER for evaluation of abdominal pain. Differential diagnosis includes but is not limited to AAA, mesenteric ischemia, appendicitis, diverticulitis, DKA, gastroenteritis, nephrolithiasis, pancreatitis, constipation, UTI, bowel obstruction, biliary disease, IBD, PUD, hepatitis. Vital signs mildly elevated blood pressure 143/87 otherwise unremarkable. Physical exam as noted above.   Morphine  and Zofran  ordered for symptoms.  I independently reviewed and interpreted the patient's labs.  Lipase within normal limits.  CMP shows mildly elevated glucose at 175, BUN at 21, albumin at 3.4.  Otherwise no electrolyte or LFT dramality.  CBC shows mild leukocytosis the white blood cell count 11.5.  Mildly concentrated hematocrit 52.1 with a normal hemoglobin.  Urinalysis shows hazy urine with greater than 300 protein present.  Rare bacteria otherwise unremarkable.  No previous urinalysis to compare this to.  Troponin 4.  CT imaging pending.  6:31 AM Care  of Bruce Marshall  transferred to [facility] at the end of my shift as the patient will require reassessment once labs/imaging have resulted. Patient presentation, ED course, and plan of care discussed with review of all pertinent labs and imaging. Please see his/her note for further details regarding further ED course and disposition. Plan at time of handoff is follow up on imaging. If CT unremarkable, order RUQ US . This may be altered or completely changed at the discretion of the oncoming team pending results of further workup.  Portions of this report may have been transcribed using voice recognition software. Every effort was made to ensure accuracy; however, inadvertent computerized transcription errors may be present.   Final Clinical Impression(s) / ED Diagnoses Final diagnoses:  None    Rx / DC Orders  ED Discharge Orders     None         Bernis Ernst, [place] 09/23/23 9367    Melvenia Motto, MD 09/23/23 [phone]

## 2023-09-23 NOTE — Op Note (Signed)
 Patient: Bruce Marshall MRN: [id number] DOB: Nov 28, 1975 Sex: male Operation/Procedure Date: 09/23/2023 Surgeons and Role:    * Polly Cordella LABOR, MD - Primary  Pre-operative Diagnoses: acute cholecystitis Postoperative Diagnoses: gangrenous cholecystitis  Procedure performed: Procedure:    LAPAROSCOPIC CHOLECYSTECTOMY CPT(R) Code:  52436 - PR LAPS SURG CHOLECYSTECTOMY LYNNELL  Procedure:    INDOCYANINE GREEN  FLUORESCENCE IMAGING (ICG)  Anesthesia: General endotracheal anesthesia  Indications: Bruce Marshall is a 48 year old male who presented to the ED with abdominal pain.  Imaging showed gallstones, and based on history and physical exam she was diagnosed with acute cholecystitis. Preoperatively, I discussed in detail the risks, benefits, alternatives, and potential complications. The patient understands and requests to proceed.  Operative Findings: Gangrenous cholecystitis. Intra-hepatic gallbladder.  Fatty liver.  19Fr drain left in the subhepatic space.   Operative Narrative: The patient was positively identified and was taken to the operating room and placed supine on the operating table. A time-out was performed confirming correct patient and procedure. We also confirmed initiation of deep venous thrombosis prophylaxis and wound prophylaxis. After successful induction of general endotracheal anesthesia, the arms were carefully padded. An orogastric tube and footboard were placed. The abdomen was prepped and draped in the usual sterile surgical fashion.  We began our peritoneal access with a veress needle inserted at Palmer's point.  After aspiration showed return of air bubbles and there was a positive saline drop test, the insufflation was connected and the abdomen brought to a pressure of .  We then used an opti-view technique to place a 5mm port just to the right of midline, superior to the umbilicus.  A laparoscope was introduced into the abdomen, and there were no  signs of injury from entry.  A 12mm port was placed in the subxiphoid position.  Two additional 5mm ports were placed in the RUQ.  A 360-degree visualization with a 30-degree 5-mm laparoscope revealed grossly normal intra-abdominal contents.   The patient was placed in the head up position and tilted slightly to the left. There was significant inflammation, making it difficult to grasp the gallbladder.  The gallbladder appeared gangrenous. A needle was used to aspirate the contents of the gallbladder, which revealed thick bile.  The dome of the gallbladder was then grasped, elevated, and retracted anteriorly and cephalad.  The infundibulum was retracted laterally and inferiorly exposing Calot's triangle. The investing visceral peritoneal attachments overlying the infundibulum of the gallbladder were incised using the hook electrocautery and dissected free from the gallbladder itself. We soon developed two structures into the gallbladder consistent with the cystic duct and cystic artery. The loose areolar tissue around these structures was dissected free. The gallbladder was separated from the gallbladder fossa for approximately a third the distance up from the cystic plate, establishing the critical view of safety. We then transitioned to infrared viewing mode to allow for visualization of the ICG tracer. There was tracer throughout the liver. There was tracer within the candidate cystic duct as well as in a separate structure medially to the duct, consistent with where the common bile duct would be expected.  There was no tracer within the candidate cystic artery.  There was tracer within the duodenum, indicating no presence of a biliary obstruction. The cystic duct and cystic artery were triply clipped. These were divided using laparoscopic scissors leaving a single clip on the removal side. The gallbladder was intra-hepatic and attempts at separating the posterior wall from fossa resulted in bleeding from his  fatty liver.  I was able to carefully elevate the gallbladder off of the gallbladder fossa using the hook Bovie most of the way up the fossa but as I approached the dome of the gallbladder it became more difficult to separate the posterior wall and the decision was made to leave a small portion of the posterior wall behind. The gallbladder was then exteriorized through the subxiphoid port site using an EndoCatch bag. We reestablished pneumoperitoneum and confirmed no leakage of blood or bile. We confirmed integrity of our clips. The subhepatic space was irrigated with warm sterile saline and suctioned free. The 12mm port site was closed using an 0-vicryl on a suture passer.We placed 0.25% Marcaine  with epinephrine  at each incision site for local anesthesia. The skin was closed using 4-0 Monocryl subcuticular suture. Dermabond was applied. The patient tolerated the procedure well, was extubated, and taken to the recovery room.  Estimated Blood Loss: 50mL Specimens: Gallbladder Implants: None Drains: None Complications: None Condition of the patient: Good, extubated Disposition: PACU  Cordella DELENA Idler Date: 09/23/2023 Time: 4:23 PM

## 2023-09-23 NOTE — H&P (Signed)
 [facility] 06-Sep-1976  [id number].    Chief Complaint/Reason for Consult: cholecystitis  HPI:  This is a 48 yo male with a history of a fib, not on anticoagulation, HTN, morbid obesity, and OSA who had some history of epigastric pain and chest pain in October.  He was evaluated and ruled out for an MI.  He was found to have gallstones.  He saw Dr. Eletha in the office in October.  Surgery was offered, but he wanted to wait until after he returned from vacation.  He has returned from vacation and unfortunately developed pain last night after eating supper yesterday.  He had some N/V, but no fevers, diarrhea, CP, or SOB.  Due to persistent pain, he presented to the ED for evaluation.  He was noted to have likely have early cholecystitis on imaging with a mild leukocytosis.  We have been asked to see him for further evaluation.  ROS: ROS: see HPI  Family History  Problem Relation Age of Onset   Healthy Daughter    Heart attack Maternal Uncle    Cancer Neg Hx    Stroke Neg Hx    Colon cancer Neg Hx    Esophageal cancer Neg Hx     Past Medical History:  Diagnosis Date   A-fib (HCC)    Chest pain    Edema    lower extremitiy   Groin pain    Hypertension    borderline    Past Surgical History:  Procedure Laterality Date   COLONOSCOPY WITH PROPOFOL  N/A 12/19/2022   Procedure: COLONOSCOPY WITH PROPOFOL ;  Surgeon: Albertus Gordy HERO, MD;  Location: [facility];  Service: Gastroenterology;  Laterality: N/A;   PILONIDAL CYST DRAINAGE     POLYPECTOMY  12/19/2022   Procedure: POLYPECTOMY;  Surgeon: Albertus Gordy HERO, MD;  Location: [facility];  Service: Gastroenterology;;   WRIST SURGERY Right     Social History:  reports that he has never smoked. He has never used smokeless tobacco. He reports that he does not currently use alcohol. He reports that he does not currently use drugs.  Allergies: No Known Allergies  (Not in a hospital admission)    Physical Exam: Blood pressure (!)  124/59, pulse 79, temperature 98.2 F (36.8 C), temperature source Oral, resp. rate 14, height 5' 6 (1.676 m), weight (!) 147.4 kg, SpO2 100%. General: pleasant, morbidly obese male who is laying in bed in NAD HEENT: head is normocephalic, atraumatic.  Sclera are noninjected.  PERRL.  Ears and nose without any masses or lesions.  Mouth is pink and moist Heart: regular, rate, and rhythm.  Normal s1,s2. No obvious murmurs, gallops, or rubs noted. Lungs: CTAB, no wheezes, rhonchi, or rales noted.  Respiratory effort nonlabored Abd: soft, tender in RUQ with deep palpation, morbid obesity, +BS, no masses, hernias, or organomegaly MS: all 4 extremities are symmetrical with no cyanosis, clubbing, or edema. Psych: A&Ox3 with an appropriate affect.   Results for orders placed or performed during the hospital encounter of 09/23/23 (from the past 48 hours)  Urinalysis, Routine w reflex microscopic -Urine, Clean Catch     Status: Abnormal   Collection Time: 09/23/23  2:44 AM  Result Value Ref Range   Color, Urine YELLOW YELLOW   APPearance HAZY (A) CLEAR   Specific Gravity, Urine 1.020 1.005 - 1.030   pH 5.0 5.0 - 8.0   Glucose, UA NEGATIVE NEGATIVE mg/dL   Hgb urine dipstick NEGATIVE NEGATIVE   Bilirubin Urine NEGATIVE NEGATIVE  Ketones, ur NEGATIVE NEGATIVE mg/dL   Protein, ur >=699 (A) NEGATIVE mg/dL   Nitrite NEGATIVE NEGATIVE   Leukocytes,Ua NEGATIVE NEGATIVE   RBC / HPF 0-5 0 - 5 RBC/hpf   WBC, UA 0-5 0 - 5 WBC/hpf   Bacteria, UA RARE (A) NONE SEEN   Squamous Epithelial / HPF 0-5 0 - 5 /HPF   Mucus PRESENT    Hyaline Casts, UA PRESENT     Comment: Performed at [facility], [address] [place], [place] [address]  Lipase, blood     Status: None   Collection Time: 09/23/23  2:45 AM  Result Value Ref Range   Lipase 43 11 - 51 U/L    Comment: Performed at [facility], [address] [place], [place] [address]  Comprehensive metabolic panel     Status: Abnormal    Collection Time: 09/23/23  2:45 AM  Result Value Ref Range   Sodium 138 135 - 145 mmol/L   Potassium 4.0 3.5 - 5.1 mmol/L   Chloride 102 98 - 111 mmol/L   CO2 23 22 - 32 mmol/L   Glucose, Bld 175 (H) 70 - 99 mg/dL    Comment: Glucose reference range applies only to samples taken after fasting for at least 8 hours.   BUN 21 (H) 6 - 20 mg/dL   Creatinine, Ser 8.92 0.61 - 1.24 mg/dL   Calcium 9.1 8.9 - 89.6 mg/dL   Total Protein 8.1 6.5 - 8.1 g/dL   Albumin 3.4 (L) 3.5 - 5.0 g/dL   AST 20 15 - 41 U/L   ALT 26 0 - 44 U/L   Alkaline Phosphatase 84 38 - 126 U/L   Total Bilirubin 0.5 0.0 - 1.2 mg/dL   GFR, Estimated >39 >39 mL/min    Comment: (NOTE) Calculated using the CKD-EPI Creatinine Equation (2021)    Anion gap 13 5 - 15    Comment: Performed at [facility], [address] [place], [place] [address]  CBC     Status: Abnormal   Collection Time: 09/23/23  2:45 AM  Result Value Ref Range   WBC 11.5 (H) 4.0 - 10.5 K/uL   RBC 5.96 (H) 4.22 - 5.81 MIL/uL   Hemoglobin 16.4 13.0 - 17.0 g/dL   HCT 47.8 (H) 60.9 - 47.9 %   MCV 87.4 80.0 - 100.0 fL   MCH 27.5 26.0 - 34.0 pg   MCHC 31.5 30.0 - 36.0 g/dL   RDW 86.0 88.4 - 84.4 %   Platelets 255 150 - 400 K/uL   nRBC 0.0 0.0 - 0.2 %    Comment: Performed at [facility], [address] [place], [place] [address]  Troponin I (High Sensitivity)     Status: None   Collection Time: 09/23/23  4:52 AM  Result Value Ref Range   Troponin I (High Sensitivity) 4 <18 ng/L    Comment: (NOTE) Elevated high sensitivity troponin I (hsTnI) values and significant  changes across serial measurements may suggest ACS but many other  chronic and acute conditions are known to elevate hsTnI results.  Refer to the Links section for chest pain algorithms and additional  guidance. Performed at [facility], [address] [place], [place] [address]   Troponin I (High Sensitivity)     Status: None   Collection Time: 09/23/23  7:05 AM   Result Value Ref Range   Troponin I (High Sensitivity) 4 <18 ng/L  Comment: (NOTE) Elevated high sensitivity troponin I (hsTnI) values and significant  changes across serial measurements may suggest ACS but many other  chronic and acute conditions are known to elevate hsTnI results.  Refer to the Links section for chest pain algorithms and additional  guidance. Performed at [facility], [address], [place], [place] [address]    US  Abdomen Limited RUQ (LIVER/GB) Result Date: 09/23/2023 CLINICAL DATA:  Right upper quadrant pain EXAM: ULTRASOUND ABDOMEN LIMITED RIGHT UPPER QUADRANT COMPARISON:  CT scan earlier same day FINDINGS: Gallbladder: Multiple gallstones evident measuring up to 9 mm towards the neck of the gallbladder. Gallbladder wall thickness upper normal at 3 mm. Sonographer reports a positive sonographic Murphy sign. No evidence for pericholecystic fluid. Common bile duct: Diameter: Upper normal at 6 mm. Liver: Liver parenchyma is echogenic without focal mass lesion evident. Portal vein is patent on color Doppler imaging with normal direction of blood flow towards the liver. Other: None. IMPRESSION: 1. Cholelithiasis with upper normal gallbladder wall thickness and positive sonographic Murphy sign. Imaging features are concerning for acute cholecystitis. If clinical picture is equivocal, nuclear scintigraphy to assess for cystic duct obstruction may prove helpful. 2. Echogenic liver parenchyma compatible with fatty infiltration. Electronically Signed   By: Camellia Candle M.D.   On: 09/23/2023 08:06   CT ABDOMEN PELVIS W CONTRAST Result Date: 09/23/2023 CLINICAL DATA:  Abdominal pain, acute, nonlocalized, with nausea. EXAM: CT ABDOMEN AND PELVIS WITH CONTRAST TECHNIQUE: Multidetector CT imaging of the abdomen and pelvis was performed using the standard protocol following bolus administration of intravenous contrast. RADIATION DOSE REDUCTION: This exam was performed according to  the departmental dose-optimization program which includes automated exposure control, adjustment of the mA and/or kV according to patient size and/or use of iterative reconstruction technique. CONTRAST:  OMNIPAQUE  IOHEXOL  350 MG/ML SOLN COMPARISON:  CTA chest 07/14/2023 FINDINGS: Lower chest: There is bronchial thickening in the lower lobes. Lung bases are clear of infiltrates. The cardiac size is normal. Hepatobiliary: Liver is 19 cm length with mild-to-moderate steatosis. Gallbladder is slightly dilated. There are few tiny stones in the proximal gallbladder but no wall thickening or pericholecystic fluid or edema. No biliary dilatation. Pancreas: No abnormality. Spleen: No abnormality.  No splenomegaly. Adrenals/Urinary Tract: There is no adrenal mass. In the upper pole of the left kidney, there is a 7 mm too small to characterize hypodense lesion most likely a Bosniak 2 cyst. No follow-up imaging is recommended. The kidneys are otherwise homogeneous. There is no urinary stone or obstruction. The bladder is unremarkable. Stomach/Bowel: No dilatation or wall thickening including the appendix. Vascular/Lymphatic: No significant vascular findings are present. No enlarged abdominal or pelvic lymph nodes. Reproductive: Enlarged prostate, 5.1 cm transverse. Other: Small umbilical and bilateral inguinal fat hernias. No incarcerated hernia. No free hemorrhage, free air or free fluid. Musculoskeletal: No acute or significant osseous findings. IMPRESSION: 1. No acute CT findings in the abdomen or pelvis. 2. Cholelithiasis with slightly dilated gallbladder but no wall thickening, pericholecystic fluid or edema. 3. Hepatic steatosis. 4. Prostatomegaly. 5. Small umbilical and bilateral inguinal fat hernias. 6. Bronchial thickening in the lower lobes.  No focal pneumonia. Electronically Signed   By: Francis Quam M.D.   On: 09/23/2023 06:39      Assessment/Plan Calculous cholecystitis The patient has been seen,  examined, labs, vitals, chart, and imaging personally reviewed.  The patient has evidence of cholecystitis.  We will plan to proceed with lap chole today and if all goes well,  dc home from PACU.  If needed, we can admit overnight.  I have explained the procedure, risks, and aftercare of cholecystectomy.  Risks include but are not limited to bleeding, infection, wound problems, anesthesia, diarrhea, bile leak, injury to common bile duct/liver/intestine.  He seems to understand and agrees to proceed.   FEN - NPO VTE - SCDs in OR ID - Rocephin  Admit - obs, hopefully DC from PACU, but if not admit overnight  A fib - gave morning dose of metoprolol  HTN OSA Morbid obesity  I reviewed nursing notes, ED provider notes, last 24 h vitals and pain scores, last 48 h intake and output, last 24 h labs and trends, and last 24 h imaging results.  Burnard FORBES Banter, [facility] 09/23/2023, 12:12 PM Please see Amion for pager number during day hours 7:00am-4:30pm or 7:00am -11:30am on weekends

## 2023-09-23 NOTE — Anesthesia Postprocedure Evaluation (Signed)
 Anesthesia Post Note  Patient: Bruce Marshall  Procedure(s) Performed: LAPAROSCOPIC CHOLECYSTECTOMY INDOCYANINE GREEN  FLUORESCENCE IMAGING (ICG) (Abdomen)     Patient location during evaluation: PACU Anesthesia Type: General Level of consciousness: awake and alert Pain management: pain level controlled Vital Signs Assessment: post-procedure vital signs reviewed and stable Respiratory status: spontaneous breathing, nonlabored ventilation, respiratory function stable and patient connected to nasal cannula oxygen Cardiovascular status: blood pressure returned to baseline and stable Postop Assessment: no apparent nausea or vomiting Anesthetic complications: no   No notable events documented.  Last Vitals:  Vitals:   09/23/23 1811 09/23/23 2005  BP: 119/66 115/61  Pulse: 75 80  Resp: 18 20  Temp: 36.7 C 36.7 C  SpO2: 95% 95%    Last Pain:  Vitals:   09/23/23 2005  TempSrc: Oral  PainSc:                  Garnette DELENA Gab

## 2023-09-24 ENCOUNTER — Other Ambulatory Visit (HOSPITAL_COMMUNITY): Payer: Self-pay

## 2023-09-24 ENCOUNTER — Encounter (HOSPITAL_COMMUNITY): Payer: Self-pay | Admitting: General Surgery

## 2023-09-24 LAB — COMPREHENSIVE METABOLIC PANEL WITH GFR
ALT: 103 U/L — ABNORMAL HIGH (ref 0–44)
AST: 74 U/L — ABNORMAL HIGH (ref 15–41)
Albumin: 3.3 g/dL — ABNORMAL LOW (ref 3.5–5.0)
Alkaline Phosphatase: 76 U/L (ref 38–126)
Anion gap: 13 (ref 5–15)
BUN: 21 mg/dL — ABNORMAL HIGH (ref 6–20)
CO2: 26 mmol/L (ref 22–32)
Calcium: 8.7 mg/dL — ABNORMAL LOW (ref 8.9–10.3)
Chloride: 97 mmol/L — ABNORMAL LOW (ref 98–111)
Creatinine, Ser: 1.33 mg/dL — ABNORMAL HIGH (ref 0.61–1.24)
GFR, Estimated: >60 mL/min (ref >60)
Glucose, Bld: 118 mg/dL — ABNORMAL HIGH (ref 70–99)
Potassium: 4.2 mmol/L (ref 3.5–5.1)
Sodium: 136 mmol/L (ref 135–145)
Total Bilirubin: 0.6 mg/dL (ref 0.0–1.2)
Total Protein: 7.9 g/dL (ref 6.5–8.1)

## 2023-09-24 LAB — CBC
HCT: 48 % (ref 39.0–52.0)
Hemoglobin: 15.3 g/dL (ref 13.0–17.0)
MCH: 27.8 pg (ref 26.0–34.0)
MCHC: 31.9 g/dL (ref 30.0–36.0)
MCV: 87.3 fL (ref 80.0–100.0)
Platelets: 276 10*3/uL (ref 150–400)
RBC: 5.5 MIL/uL (ref 4.22–5.81)
RDW: 14.3 % (ref 11.5–15.5)
WBC: 17.9 10*3/uL — ABNORMAL HIGH (ref 4.0–10.5)
nRBC: 0 % (ref 0.0–0.2)

## 2023-09-24 MED ORDER — OXYCODONE HCL 5 MG PO TABS
2.5000 mg | ORAL_TABLET | Freq: Four times a day (QID) | ORAL | 0 refills | Status: DC | PRN
  Filled 2023-09-24: qty 10, 3d supply, fill #0

## 2023-09-24 MED ORDER — ACETAMINOPHEN 500 MG PO TABS: 1000.0000 mg | ORAL_TABLET | Freq: Four times a day (QID) | ORAL | Status: DC | PRN

## 2023-09-24 NOTE — Progress Notes (Signed)
 Regular diet for lunch per order. Patient tolerate well. Will continue to monitor.

## 2023-09-24 NOTE — Progress Notes (Signed)
 1 Day Post-Op  Subjective: Endorsing some incisional pain but tolerating PO and has been ambulating. Drain is SS  ROS: See above, otherwise other systems negative  Objective: Vital signs in last 24 hours: Temp:  [97.5 F (36.4 C)-98.9 F (37.2 C)] 97.6 F (36.4 C) (01/08 0749) Pulse Rate:  [64-88] 71 (01/08 0749) Resp:  [14-29] 20 (01/08 0749) BP: (103-137)/(55-89) 109/73 (01/08 0749) SpO2:  [90 %-100 %] 98 % (01/08 0749) Weight:  [145.2 kg] 145.2 kg (01/07 1300) Last BM Date : 09/23/23  Intake/Output from previous day: 01/07 0701 - 01/08 0700 In: 600 [I.V.:500; IV Piggyback:100] Out: 40 [Drains:35; Blood:5] Intake/Output this shift: No intake/output data recorded.  PE: Gen: male resting, NAD Abd: soft, non-distended, port sites clean and dry, JP with SS output   Lab Results:  Recent Labs    09/23/23 0245 09/23/23 1844  WBC 11.5* 14.7*  HGB 16.4 16.4  HCT 52.1* 50.7  PLT 255 260   BMET Recent Labs    09/23/23 0245 09/23/23 1844  NA 138  --   K 4.0  --   CL 102  --   CO2 23  --   GLUCOSE 175*  --   BUN 21*  --   CREATININE 1.07 1.29*  CALCIUM 9.1  --    PT/INR No results for input(s): LABPROT, INR in the last 72 hours. CMP     Component Value Date/Time   NA 138 09/23/2023 0245   NA 141 05/09/2022 0953   K 4.0 09/23/2023 0245   CL 102 09/23/2023 0245   CO2 23 09/23/2023 0245   GLUCOSE 175 (H) 09/23/2023 0245   BUN 21 (H) 09/23/2023 0245   BUN 16 05/09/2022 0953   CREATININE 1.29 (H) 09/23/2023 1844   CALCIUM 9.1 09/23/2023 0245   PROT 8.1 09/23/2023 0245   PROT 7.4 05/09/2022 0953   ALBUMIN 3.4 (L) 09/23/2023 0245   ALBUMIN 4.1 05/09/2022 0953   AST 20 09/23/2023 0245   ALT 26 09/23/2023 0245   ALKPHOS 84 09/23/2023 0245   BILITOT 0.5 09/23/2023 0245   BILITOT 0.3 05/09/2022 0953   GFRNONAA >60 09/23/2023 1844   GFRAA >60 10/02/2018 1338   Lipase     Component Value Date/Time   LIPASE 43 09/23/2023 0245     Studies/Results: US  Abdomen Limited RUQ (LIVER/GB) Result Date: 09/23/2023 CLINICAL DATA:  Right upper quadrant pain EXAM: ULTRASOUND ABDOMEN LIMITED RIGHT UPPER QUADRANT COMPARISON:  CT scan earlier same day FINDINGS: Gallbladder: Multiple gallstones evident measuring up to 9 mm towards the neck of the gallbladder. Gallbladder wall thickness upper normal at 3 mm. Sonographer reports a positive sonographic Murphy sign. No evidence for pericholecystic fluid. Common bile duct: Diameter: Upper normal at 6 mm. Liver: Liver parenchyma is echogenic without focal mass lesion evident. Portal vein is patent on color Doppler imaging with normal direction of blood flow towards the liver. Other: None. IMPRESSION: 1. Cholelithiasis with upper normal gallbladder wall thickness and positive sonographic Murphy sign. Imaging features are concerning for acute cholecystitis. If clinical picture is equivocal, nuclear scintigraphy to assess for cystic duct obstruction may prove helpful. 2. Echogenic liver parenchyma compatible with fatty infiltration. Electronically Signed   By: Camellia Candle M.D.   On: 09/23/2023 08:06   CT ABDOMEN PELVIS W CONTRAST Result Date: 09/23/2023 CLINICAL DATA:  Abdominal pain, acute, nonlocalized, with nausea. EXAM: CT ABDOMEN AND PELVIS WITH CONTRAST TECHNIQUE: Multidetector CT imaging of the abdomen and pelvis was performed using the standard protocol  following bolus administration of intravenous contrast. RADIATION DOSE REDUCTION: This exam was performed according to the departmental dose-optimization program which includes automated exposure control, adjustment of the mA and/or kV according to patient size and/or use of iterative reconstruction technique. CONTRAST:  OMNIPAQUE  IOHEXOL  350 MG/ML SOLN COMPARISON:  CTA chest 07/14/2023 FINDINGS: Lower chest: There is bronchial thickening in the lower lobes. Lung bases are clear of infiltrates. The cardiac size is normal. Hepatobiliary: Liver  is 19 cm length with mild-to-moderate steatosis. Gallbladder is slightly dilated. There are few tiny stones in the proximal gallbladder but no wall thickening or pericholecystic fluid or edema. No biliary dilatation. Pancreas: No abnormality. Spleen: No abnormality.  No splenomegaly. Adrenals/Urinary Tract: There is no adrenal mass. In the upper pole of the left kidney, there is a 7 mm too small to characterize hypodense lesion most likely a Bosniak 2 cyst. No follow-up imaging is recommended. The kidneys are otherwise homogeneous. There is no urinary stone or obstruction. The bladder is unremarkable. Stomach/Bowel: No dilatation or wall thickening including the appendix. Vascular/Lymphatic: No significant vascular findings are present. No enlarged abdominal or pelvic lymph nodes. Reproductive: Enlarged prostate, 5.1 cm transverse. Other: Small umbilical and bilateral inguinal fat hernias. No incarcerated hernia. No free hemorrhage, free air or free fluid. Musculoskeletal: No acute or significant osseous findings. IMPRESSION: 1. No acute CT findings in the abdomen or pelvis. 2. Cholelithiasis with slightly dilated gallbladder but no wall thickening, pericholecystic fluid or edema. 3. Hepatic steatosis. 4. Prostatomegaly. 5. Small umbilical and bilateral inguinal fat hernias. 6. Bronchial thickening in the lower lobes.  No focal pneumonia. Electronically Signed   By: Francis Quam M.D.   On: 09/23/2023 06:39    Anti-infectives: Anti-infectives (From admission, onward)    Start     Dose/Rate Route Frequency Ordered Stop   09/24/23 0600  cefTRIAXone  (ROCEPHIN ) 2 g in sodium chloride  0.9 % 100 mL IVPB        2 g 200 mL/hr over 30 Minutes Intravenous On call to O.R. 09/23/23 1239 09/24/23 0818       Assessment/Plan 48 y/o M who presented with abdominal pain and is now s/p lap chole for gangrenous cholecystitis   FEN - Advance to regular diet VTE - Lovenox  ID - None indicated Dispo - Awaiting labs.   Possibly home later today if tolerating PO and labs are reassuring.  Drain out before discharge.    LOS: 0 days   Cordella DELENA Polly [facility] 09/24/2023, 8:52 AM Please see Amion for pager number during day hours 7:00am-4:30pm or 7:00am -11:30am on weekends

## 2023-09-24 NOTE — Discharge Summary (Signed)
 [facility] Discharge Summary   Patient ID: Bruce Marshall MRN: [id number] DOB/AGE: 1976-05-25 48 y.o.  Admit date: 09/23/2023 Discharge date: 09/24/2023  Admitting Diagnosis: Acute cholecystitis   Discharge Diagnosis S/P laparoscopic cholecystectomy   Consultants None   Imaging: US  Abdomen Limited RUQ (LIVER/GB) Result Date: 09/23/2023 CLINICAL DATA:  Right upper quadrant pain EXAM: ULTRASOUND ABDOMEN LIMITED RIGHT UPPER QUADRANT COMPARISON:  CT scan earlier same day FINDINGS: Gallbladder: Multiple gallstones evident measuring up to 9 mm towards the neck of the gallbladder. Gallbladder wall thickness upper normal at 3 mm. Sonographer reports a positive sonographic Murphy sign. No evidence for pericholecystic fluid. Common bile duct: Diameter: Upper normal at 6 mm. Liver: Liver parenchyma is echogenic without focal mass lesion evident. Portal vein is patent on color Doppler imaging with normal direction of blood flow towards the liver. Other: None. IMPRESSION: 1. Cholelithiasis with upper normal gallbladder wall thickness and positive sonographic Murphy sign. Imaging features are concerning for acute cholecystitis. If clinical picture is equivocal, nuclear scintigraphy to assess for cystic duct obstruction may prove helpful. 2. Echogenic liver parenchyma compatible with fatty infiltration. Electronically Signed   By: Camellia Candle M.D.   On: 09/23/2023 08:06   CT ABDOMEN PELVIS W CONTRAST Result Date: 09/23/2023 CLINICAL DATA:  Abdominal pain, acute, nonlocalized, with nausea. EXAM: CT ABDOMEN AND PELVIS WITH CONTRAST TECHNIQUE: Multidetector CT imaging of the abdomen and pelvis was performed using the standard protocol following bolus administration of intravenous contrast. RADIATION DOSE REDUCTION: This exam was performed according to the departmental dose-optimization program which includes automated exposure control, adjustment of the mA and/or kV according to patient size and/or  use of iterative reconstruction technique. CONTRAST:  OMNIPAQUE  IOHEXOL  350 MG/ML SOLN COMPARISON:  CTA chest 07/14/2023 FINDINGS: Lower chest: There is bronchial thickening in the lower lobes. Lung bases are clear of infiltrates. The cardiac size is normal. Hepatobiliary: Liver is 19 cm length with mild-to-moderate steatosis. Gallbladder is slightly dilated. There are few tiny stones in the proximal gallbladder but no wall thickening or pericholecystic fluid or edema. No biliary dilatation. Pancreas: No abnormality. Spleen: No abnormality.  No splenomegaly. Adrenals/Urinary Tract: There is no adrenal mass. In the upper pole of the left kidney, there is a 7 mm too small to characterize hypodense lesion most likely a Bosniak 2 cyst. No follow-up imaging is recommended. The kidneys are otherwise homogeneous. There is no urinary stone or obstruction. The bladder is unremarkable. Stomach/Bowel: No dilatation or wall thickening including the appendix. Vascular/Lymphatic: No significant vascular findings are present. No enlarged abdominal or pelvic lymph nodes. Reproductive: Enlarged prostate, 5.1 cm transverse. Other: Small umbilical and bilateral inguinal fat hernias. No incarcerated hernia. No free hemorrhage, free air or free fluid. Musculoskeletal: No acute or significant osseous findings. IMPRESSION: 1. No acute CT findings in the abdomen or pelvis. 2. Cholelithiasis with slightly dilated gallbladder but no wall thickening, pericholecystic fluid or edema. 3. Hepatic steatosis. 4. Prostatomegaly. 5. Small umbilical and bilateral inguinal fat hernias. 6. Bronchial thickening in the lower lobes.  No focal pneumonia. Electronically Signed   By: Francis Quam M.D.   On: 09/23/2023 06:39    Procedures Dr. Cordella Idler (09/23/23) - Laparoscopic Cholecystectomy with ICG dye  Hospital Course:  Patient is a 48 year old male who presented to the ED with abdominal pain.  Workup showed acute cholecystitis.   Patient was admitted and underwent procedure listed above.  Tolerated procedure well and was transferred to the floor.  Diet was advanced as tolerated.  On POD1, the patient was voiding well, tolerating diet, ambulating well, pain well controlled, vital signs stable, incisions c/d/i and felt stable for discharge home. Drain to be removed prior to discharge.  Patient will follow up in our office in 2-3 weeks and knows to call with questions or concerns.  He will call to confirm appointment date/time.    I or a member of my team have reviewed this patient in the Controlled Substance Database.   Allergies as of 09/24/2023   No Known Allergies      Medication List     STOP taking these medications    oxyCODONE -acetaminophen  5-325 MG tablet Commonly known as: PERCOCET/ROXICET       TAKE these medications    acetaminophen  500 MG tablet Commonly known as: TYLENOL  Take 2 tablets (1,000 mg total) by mouth every 6 (six) hours as needed for mild pain (pain score 1-3) or fever.   albuterol 108 (90 Base) MCG/ACT inhaler Commonly known as: VENTOLIN HFA Inhale into the lungs as needed for wheezing or shortness of breath.   apixaban  5 MG Tabs tablet Commonly known as: Eliquis  Take 1 tablet (5 mg total) by mouth 2 (two) times daily.   flecainide  150 MG tablet Commonly known as: TAMBOCOR  Take 1 tablet (150 mg total) by mouth 2 (two) times daily.   furosemide  20 MG tablet Commonly known as: LASIX  Take 1 tablet (20 mg total) by mouth daily as needed (leg swelling).   metoprolol  tartrate 25 MG tablet Commonly known as: LOPRESSOR  Take 1 tablet (25 mg total) by mouth 2 (two) times daily.   oxyCODONE  5 MG immediate release tablet Commonly known as: Oxy IR/ROXICODONE  Take 0.5-1 tablets (2.5-5 mg total) by mouth every 6 (six) hours as needed for moderate pain (pain score 4-6).          Follow-up Information     Maczis, Puja Gosai, PA-C Follow up on 10/14/2023.   Specialty: General  Surgery Why: 10:00am, Arrive 30 minutes prior to your appointment time, Please bring your insurance card and photo ID Contact information: [address] [place] [address] [facility] [place] [address] [phone]                 Signed: Burnard JONELLE Louder , [facility] 09/24/2023, 2:41 PM Please see Amion for pager number during day hours 7:00am-4:30pm

## 2023-09-25 ENCOUNTER — Telehealth: Payer: Self-pay

## 2023-09-25 LAB — SURGICAL PATHOLOGY

## 2023-09-25 NOTE — Transitions of Care (Post Inpatient/ED Visit) (Signed)
   09/25/2023  Name: Bruce Marshall MRN: [id number] DOB: 04/27/1976  Today's TOC FU Call Status: Today's TOC FU Call Status:: Successful TOC FU Call Completed TOC FU Call Complete Date: 09/25/23 Patient's Name and Date of Birth confirmed.  Transition Care Management Follow-up Telephone Call Date of Discharge: 09/24/23 Discharge Facility: [facility]) Type of Discharge: Inpatient Admission Primary Inpatient Discharge Diagnosis:: acute cholecystitis How have you been since you were released from the hospital?: Better (he reports that he is still in pain, trying to wean himself from the narcotics.) Any questions or concerns?: Yes Patient Questions/Concerns:: he thinks he may be due for immunizations. Patient Questions/Concerns Addressed: Other: (scheduled him with Dr Tanda - 10/20/2023.  He said he needs to be seen on a Monday)  Items Reviewed: Did you receive and understand the discharge instructions provided?: Yes Medications obtained,verified, and reconciled?: No Medications Not Reviewed Reasons:: Other: (he said he has all of his medications and did not have any questions about the med regime) Any new allergies since your discharge?: No Dietary orders reviewed?: Yes Type of Diet Ordered:: heart healthy, low sodium.  he said he is moving his bowels well. Do you have support at home?: Yes Name of Support/Comfort Primary Source: his wife  Medications Reviewed Today: Medications Reviewed Today   Medications were not reviewed in this encounter     Home Care and Equipment/Supplies: Were Home Health Services Ordered?: No Any new equipment or medical supplies ordered?: No  Functional Questionnaire: Do you need assistance with bathing/showering or dressing?: No Do you need assistance with meal preparation?: No Do you need assistance with eating?: No Do you have difficulty maintaining continence: No Do you need assistance with getting out of bed/getting out of a chair/moving?:  No Do you have difficulty managing or taking your medications?: No  Follow up appointments reviewed: PCP Follow-up appointment confirmed?: Yes Date of PCP follow-up appointment?: 10/20/23 Follow-up Provider: Dr Tanda.  he has not seen her in 1 1/2 years Specialist Hospital Follow-up appointment confirmed?: Yes Date of Specialist follow-up appointment?: 10/14/23 Follow-Up Specialty Provider:: surgeon.  11/21/2023 - [facility] Do you need transportation to your follow-up appointment?: No Do you understand care options if your condition(s) worsen?: Yes-patient verbalized understanding    SIGNATURE Slater Diesel, RN

## 2023-10-20 ENCOUNTER — Ambulatory Visit (INDEPENDENT_AMBULATORY_CARE_PROVIDER_SITE_OTHER): Payer: 59 | Admitting: Family Medicine

## 2023-10-20 ENCOUNTER — Encounter: Payer: Self-pay | Admitting: Family Medicine

## 2023-10-20 VITALS — BP 127/80 | HR 67 | Temp 98.2°F | Resp 18 | Ht 66.0 in | Wt 311.8 lb

## 2023-10-20 DIAGNOSIS — Z23 Encounter for immunization: Secondary | ICD-10-CM | POA: Diagnosis not present

## 2023-10-20 DIAGNOSIS — Z6841 Body Mass Index (BMI) 40.0 and over, adult: Secondary | ICD-10-CM

## 2023-10-20 DIAGNOSIS — R109 Unspecified abdominal pain: Secondary | ICD-10-CM

## 2023-10-20 DIAGNOSIS — M25562 Pain in left knee: Secondary | ICD-10-CM | POA: Diagnosis not present

## 2023-10-20 DIAGNOSIS — E66813 Obesity, class 3: Secondary | ICD-10-CM | POA: Diagnosis not present

## 2023-10-20 NOTE — Progress Notes (Unsigned)
Established Patient Office Visit  Subjective    Patient ID: Bruce Marshall, male    DOB: November 19, 1975  Age: 48 y.o. MRN: [id number]  CC:  Chief Complaint  Patient presents with   Follow-up    T Dap, patient has inside tissue pain when turned inward    HPI Bruce Marshall presents ***Patient had GB surgery about 3 weeks ago.Patient also reports left sided knee painHe reports kicking tree branch and then having problems walking and with persistent pain.   Outpatient Encounter Medications as of 10/20/2023  Medication Sig   albuterol (VENTOLIN HFA) 108 (90 Base) MCG/ACT inhaler Inhale into the lungs as needed for wheezing or shortness of breath.   flecainide (TAMBOCOR) 150 MG tablet Take 1 tablet (150 mg total) by mouth 2 (two) times daily.   metoprolol tartrate (LOPRESSOR) 25 MG tablet Take 1 tablet (25 mg total) by mouth 2 (two) times daily.   acetaminophen (TYLENOL) 500 MG tablet Take 2 tablets (1,000 mg total) by mouth every 6 (six) hours as needed for mild pain (pain score 1-3) or fever. (Patient not taking: Reported on 10/20/2023)   apixaban (ELIQUIS) 5 MG TABS tablet Take 1 tablet (5 mg total) by mouth 2 (two) times daily. (Patient not taking: Reported on 06/09/2023)   furosemide (LASIX) 20 MG tablet Take 1 tablet (20 mg total) by mouth daily as needed (leg swelling). (Patient not taking: Reported on 09/23/2023)   oxyCODONE (OXY IR/ROXICODONE) 5 MG immediate release tablet Take 0.5-1 tablets (2.5-5 mg total) by mouth every 6 (six) hours as needed for moderate pain (pain score 4-6). (Patient not taking: Reported on 10/20/2023)   No facility-administered encounter medications on file as of 10/20/2023.    Past Medical History:  Diagnosis Date   A-fib (HCC)    Chest pain    Edema    lower extremitiy   Groin pain    Hypertension    borderline   Sleep apnea    no CPAP    Past Surgical History:  Procedure Laterality Date   CHOLECYSTECTOMY N/A 09/23/2023   Procedure: LAPAROSCOPIC  CHOLECYSTECTOMY;  Surgeon: Moise Boring, MD;  Location: [facility] OR;  Service: General;  Laterality: N/A;   COLONOSCOPY WITH PROPOFOL N/A 12/19/2022   Procedure: COLONOSCOPY WITH PROPOFOL;  Surgeon: Beverley Fiedler, MD;  Location: [facility] ENDOSCOPY;  Service: Gastroenterology;  Laterality: N/A;   PILONIDAL CYST DRAINAGE     POLYPECTOMY  12/19/2022   Procedure: POLYPECTOMY;  Surgeon: Beverley Fiedler, MD;  Location: [facility] ENDOSCOPY;  Service: Gastroenterology;;   WRIST SURGERY Right     Family History  Problem Relation Age of Onset   Healthy Daughter    Heart attack Maternal Uncle    Cancer Neg Hx    Stroke Neg Hx    Colon cancer Neg Hx    Esophageal cancer Neg Hx     Social History   Socioeconomic History   Marital status: Married    Spouse name: Not on file   Number of children: Not on file   Years of education: Not on file   Highest education level: Not on file  Occupational History   Not on file  Tobacco Use   Smoking status: Never   Smokeless tobacco: Never   Tobacco comments:    Never smoke 11/06/22  Vaping Use   Vaping status: Never Used  Substance and Sexual Activity   Alcohol use: Not Currently   Drug use: Not Currently   Sexual activity: Not on file  Other Topics Concern   Not on file  Social History Narrative   Not on file   Social Drivers of Health   Financial Resource Strain: Low Risk  (10/20/2023)   Overall Financial Resource Strain (CARDIA)    Difficulty of Paying Living Expenses: Not hard at all  Food Insecurity: No Food Insecurity (10/20/2023)   Hunger Vital Sign    Worried About Running Out of Food in the Last Year: Never true    Ran Out of Food in the Last Year: Never true  Transportation Needs: No Transportation Needs (10/20/2023)   PRAPARE - Administrator, Civil Service (Medical): No    Lack of Transportation (Non-Medical): No  Physical Activity: Inactive (10/20/2023)   Exercise Vital Sign    Days of Exercise per Week: 0 days    Minutes of Exercise  per Session: 0 min  Stress: No Stress Concern Present (10/20/2023)   [employer] of Occupational Health - Occupational Stress Questionnaire    Feeling of Stress : Only a little  Social Connections: Moderately Integrated (10/20/2023)   Social Connection and Isolation Panel [NHANES]    Frequency of Communication with Friends and Family: More than three times a week    Frequency of Social Gatherings with Friends and Family: Once a week    Attends Religious Services: More than 4 times per year    Active Member of [employer] or Organizations: No    Attends Banker Meetings: Never    Marital Status: Married  Catering manager Violence: Not At Risk (10/20/2023)   Humiliation, Afraid, Rape, and Kick questionnaire    Fear of Current or Ex-Partner: No    Emotionally Abused: No    Physically Abused: No    Sexually Abused: No    ROS      Objective    BP 127/80 (BP Location: Right Arm, Patient Position: Sitting, Cuff Size: Large)   Pulse 67   Temp 98.2 F (36.8 C) (Oral)   Resp 18   Ht 5\' 6"  (1.676 m)   Wt (!) 311 lb 12.8 oz (141.4 kg)   SpO2 96%   BMI 50.33 kg/m   Physical Exam  {Labs (Optional):23779}    Assessment & Plan:   Immunization due -     Tdap vaccine greater than or equal to 7yo IM  Encounter for immunization -     Flu vaccine trivalent PF, 6mos and older(Flulaval,Afluria,Fluarix,Fluzone)     No follow-ups on file.   Tommie Raymond, MD

## 2023-10-22 ENCOUNTER — Encounter: Payer: Self-pay | Admitting: Family Medicine

## 2023-12-01 ENCOUNTER — Encounter (HOSPITAL_COMMUNITY): Payer: Self-pay | Admitting: Physician Assistant

## 2023-12-01 ENCOUNTER — Ambulatory Visit (HOSPITAL_COMMUNITY)
Admission: RE | Admit: 2023-12-01 | Discharge: 2023-12-01 | Disposition: A | Discharge status: Home / Self Care | Payer: BC Managed Care – PPO | Source: Ambulatory Visit | Attending: Physician Assistant | Admitting: Physician Assistant

## 2023-12-01 VITALS — BP 126/74 | HR 70 | Ht 66.0 in | Wt 314.6 lb

## 2023-12-01 DIAGNOSIS — I480 Paroxysmal atrial fibrillation: Secondary | ICD-10-CM | POA: Diagnosis not present

## 2023-12-01 DIAGNOSIS — G4733 Obstructive sleep apnea (adult) (pediatric): Secondary | ICD-10-CM | POA: Diagnosis not present

## 2023-12-01 DIAGNOSIS — Z79899 Other long term (current) drug therapy: Secondary | ICD-10-CM

## 2023-12-01 DIAGNOSIS — I4892 Unspecified atrial flutter: Secondary | ICD-10-CM | POA: Diagnosis not present

## 2023-12-01 DIAGNOSIS — E669 Obesity, unspecified: Secondary | ICD-10-CM | POA: Diagnosis not present

## 2023-12-01 DIAGNOSIS — Z6841 Body Mass Index (BMI) 40.0 and over, adult: Secondary | ICD-10-CM | POA: Insufficient documentation

## 2023-12-01 DIAGNOSIS — Z5181 Encounter for therapeutic drug level monitoring: Secondary | ICD-10-CM

## 2023-12-01 NOTE — Progress Notes (Signed)
 Primary Care Physician: Georganna Skeans, MD Primary Cardiologist: Dr Izora Ribas  Primary Electrophysiologist: none Referring Physician: [facility]   Bruce Marshall is a 48 y.o. male with a history of OSA and atrial fibrillation who presents for follow up in the [facility].  The patient was initially diagnosed with atrial fibrillation 10/22/22 after for a colonoscopy with Dr Rhea Belton for a positive Cologuard. He was beign prepped when his heart rate was noted to be 130s-140s bpm. An ECG was done which showed afib with RVR (ECG not available for review). The colonoscopy was cancelled and he was sent to the ED for evaluation. He was given IV metoprolol and converted to SR. Patient was not started on anticoagulation for a CHADS2VASC score of 0. He was discharged with PRN metoprolol. Patient reported at his visit 11/06/22 that for the previous 6 months he has had "fluttering" in his chest at night. He did not seek evaluation because it always resolved quickly. He also noticed occasional heart rates in the 150s bpm on his smart watch.   Zio monitor placed 11/06/22 showed 56% afib burden, started on metoprolol BID. Patient also had a sleep study 12/06/22 which showed severe OSA. He was started on flecainide, increased on 03/04/23.  Patient returns for follow up for atrial fibrillation and flecainide monitoring. He has had 4-5 episodes of afib since his last visit, lasting 2-3 hours. He admits that the majority of those episodes occurred when he forgot a dose of flecainide. He is using a CPAP machine.   Today, he denies symptoms of palpitations, chest pain, shortness of breath, orthopnea, PND, lower extremity edema, dizziness, presyncope, syncope, bleeding, or neurologic sequela. The patient is tolerating medications without difficulties and is otherwise without complaint today.    Atrial Fibrillation Risk Factors:  he does have symptoms or diagnosis of sleep apnea. he does  not have a history of rheumatic fever. The patient does not have a history of early familial atrial fibrillation or other arrhythmias.   Atrial Fibrillation Management history:  Previous antiarrhythmic drugs: flecainide  Previous cardioversions: none Previous ablations: none Anticoagulation history: none   Past Medical History:  Diagnosis Date   A-fib (HCC)    Chest pain    Edema    lower extremitiy   Groin pain    Hypertension    borderline   Sleep apnea    no CPAP   Past Surgical History:  Procedure Laterality Date   CHOLECYSTECTOMY N/A 09/23/2023   Procedure: LAPAROSCOPIC CHOLECYSTECTOMY;  Surgeon: Moise Boring, MD;  Location[facility] OR;  Service: General;  Laterality: N/A;   COLONOSCOPY WITH PROPOFOL N/A 12/19/2022   Procedure: COLONOSCOPY WITH PROPOFOL;  Surgeon: Beverley Fiedler, MD;  Location: [facility] ENDOSCOPY;  Service: Gastroenterology;  Laterality: N/A;   PILONIDAL CYST DRAINAGE     POLYPECTOMY  12/19/2022   Procedure: POLYPECTOMY;  Surgeon: Beverley Fiedler, MD;  Location: [facility] ENDOSCOPY;  Service: Gastroenterology;;   WRIST SURGERY Right     ROS- All systems are reviewed and negative except as per the HPI above.  Physical Exam: Vitals:   12/01/23 0846  BP: 126/74  Pulse: 70  Weight: (!) 142.7 kg  Height: 5\' 6"  (1.676 m)     GEN: Well nourished, well developed in no acute distress NECK: No JVD; No carotid bruits CARDIAC: Regular rate and rhythm, no murmurs, rubs, gallops RESPIRATORY:  Clear to auscultation without rales, wheezing or rhonchi  ABDOMEN: Soft, non-tender, non-distended EXTREMITIES:  trace bilateral  edema, L>R, No deformity    Wt Readings from Last 3 Encounters:  12/01/23 (!) 142.7 kg  10/20/23 (!) 141.4 kg  09/23/23 (!) 145.2 kg    EKG today demonstrates  SR Vent. rate 70 BPM PR interval 178 ms QRS duration 82 ms QT/QTcB 414/447 ms   Echo 12/02/22 1. Left ventricular ejection fraction, by estimation, is 55 to 60%. The  left ventricle has  normal function. The left ventricle has no regional  wall motion abnormalities. There is mild concentric left ventricular  hypertrophy. Left ventricular diastolic parameters are indeterminate.   2. Right ventricular systolic function is normal. The right ventricular  size is normal.   3. The mitral valve is normal in structure. No evidence of mitral valve  regurgitation. No evidence of mitral stenosis.   4. The aortic valve was not well visualized. Aortic valve regurgitation  is not visualized.   Comparison(s): No prior Echocardiogram.    Epic records are reviewed at length today  CHA2DS2-VASc Score = 0  The patient's score is based upon: CHF History: 0 HTN History: 0 Diabetes History: 0 Stroke History: 0 Vascular Disease History: 0 Age Score: 0 Gender Score: 0       ASSESSMENT AND PLAN: Paroxysmal Atrial Fibrillation/atrial flutter The patient's CHA2DS2-VASc score is 0, indicating a 0.2% annual risk of stroke.   Patient appears to be maintaining SR, with brief episodes.  Continue flecainide 150 mg BID Continue Lopressor 25 mg BID Patient not currently on anticoagulation with low CV score Apple Watch for home monitoring.  High Risk Medication Monitoring (ICD 10: Z79.899) Intervals on ECG acceptable for flecainide monitoring.      Obesity Body mass index is 50.78 kg/m.  Encouraged lifestyle modification  OSA  Severe on study 12/06/22 Encouraged nightly CPAP Followed at [facility] sleep.   Follow up in the AF clinic in 6 months. Due for 1 year follow up with Dr Izora Ribas.    [name] PA-C [facility] [address] [place], [place] [address] [phone] 12/01/2023 9:03 AM

## 2023-12-11 ENCOUNTER — Telehealth: Payer: Self-pay | Admitting: Internal Medicine

## 2023-12-11 ENCOUNTER — Encounter: Payer: Self-pay | Admitting: Internal Medicine

## 2023-12-11 NOTE — Telephone Encounter (Signed)
 LVM with patient 3x to schedule f/u with Dr. Izora Ribas. Will send letter.

## 2023-12-11 NOTE — Telephone Encounter (Signed)
-----   Message from Nurse Stacy C sent at 12/01/2023  9:20 AM EDT ----- Regarding: appt with dr Ronnie Doss Pt needs routine follow up with dr Ronnie Doss per Bruce Marshall thanks stacy

## 2024-05-31 ENCOUNTER — Ambulatory Visit (HOSPITAL_COMMUNITY)
Admission: RE | Admit: 2024-05-31 | Discharge: 2024-05-31 | Disposition: A | Discharge status: Home / Self Care | Source: Ambulatory Visit | Attending: Physician Assistant | Admitting: Physician Assistant

## 2024-05-31 VITALS — BP 110/78 | HR 63 | Ht 66.0 in | Wt 327.6 lb

## 2024-05-31 DIAGNOSIS — I480 Paroxysmal atrial fibrillation: Secondary | ICD-10-CM

## 2024-05-31 DIAGNOSIS — Z5181 Encounter for therapeutic drug level monitoring: Secondary | ICD-10-CM | POA: Diagnosis not present

## 2024-05-31 DIAGNOSIS — Z79899 Other long term (current) drug therapy: Secondary | ICD-10-CM

## 2024-05-31 DIAGNOSIS — I4891 Unspecified atrial fibrillation: Secondary | ICD-10-CM | POA: Diagnosis not present

## 2024-05-31 NOTE — Progress Notes (Signed)
 Primary Care Physician: Tanda Bleacher, MD Primary Cardiologist: Dr Santo  Primary Electrophysiologist: none Referring Physician: [facility]   Bruce Marshall is a 48 y.o. male with a history of OSA and atrial fibrillation who presents for follow up in the [facility].  The patient was initially diagnosed with atrial fibrillation 10/22/22 after for a colonoscopy with Dr Albertus for a positive Cologuard. He was beign prepped when his heart rate was noted to be 130s-140s bpm. An ECG was done which showed afib with RVR (ECG not available for review). The colonoscopy was cancelled and he was sent to the ED for evaluation. He was given IV metoprolol  and converted to SR. Patient was not started on anticoagulation for a CHADS2VASC score of 0. He was discharged with PRN metoprolol . Patient reported at his visit 11/06/22 that for the previous 6 months he has had fluttering in his chest at night. He did not seek evaluation because it always resolved quickly. He also noticed occasional heart rates in the 150s bpm on his smart watch.   Zio monitor placed 11/06/22 showed 56% afib burden, started on metoprolol  BID. Patient also had a sleep study 12/06/22 which showed severe OSA. He was started on flecainide , increased on 03/04/23.  Patient returns for follow up for atrial fibrillation and flecainide  monitoring. He reports that he has occasional episodes of afib, typically associated with a missed dose of flecainide . The episodes are brief. He consistently uses his CPAP. He is frustrated with his lack of progress with weight loss.   Today, he  denies symptoms of palpitations, chest pain, shortness of breath, orthopnea, PND, lower extremity edema, dizziness, presyncope, syncope, bleeding, or neurologic sequela. The patient is tolerating medications without difficulties and is otherwise without complaint today.    Atrial Fibrillation Risk Factors:  he does have symptoms or  diagnosis of sleep apnea. he does not have a history of rheumatic fever. The patient does not have a history of early familial atrial fibrillation or other arrhythmias.   Atrial Fibrillation Management history:  Previous antiarrhythmic drugs: flecainide   Previous cardioversions: none Previous ablations: none Anticoagulation history: none   Past Medical History:  Diagnosis Date   A-fib (HCC)    Chest pain    Edema    lower extremitiy   Groin pain    Hypertension    borderline   Sleep apnea    no CPAP   Past Surgical History:  Procedure Laterality Date   CHOLECYSTECTOMY N/A 09/23/2023   Procedure: LAPAROSCOPIC CHOLECYSTECTOMY;  Surgeon: Polly Cordella LABOR, MD;  Location: [facility] OR;  Service: General;  Laterality: N/A;   COLONOSCOPY WITH PROPOFOL  N/A 12/19/2022   Procedure: COLONOSCOPY WITH PROPOFOL ;  Surgeon: Albertus Gordy HERO, MD;  Location: [facility];  Service: Gastroenterology;  Laterality: N/A;   PILONIDAL CYST DRAINAGE     POLYPECTOMY  12/19/2022   Procedure: POLYPECTOMY;  Surgeon: Albertus Gordy HERO, MD;  Location: [facility];  Service: Gastroenterology;;   WRIST SURGERY Right     ROS- All systems are reviewed and negative except as per the HPI above.  Physical Exam: Vitals:   05/31/24 0858  BP: 110/78  Pulse: 63  Weight: (!) 148.6 kg  Height: 5' 6 (1.676 m)    GEN: Well nourished, well developed in no acute distress CARDIAC: Regular rate and rhythm, no murmurs, rubs, gallops RESPIRATORY:  Clear to auscultation without rales, wheezing or rhonchi  ABDOMEN: Soft, non-tender, non-distended EXTREMITIES:  No edema; No deformity  Wt Readings from Last 3 Encounters:  05/31/24 (!) 148.6 kg  12/01/23 (!) 142.7 kg  10/20/23 (!) 141.4 kg    EKG today demonstrates  SR Vent. rate 63 BPM PR interval 198 ms QRS duration 92 ms QT/QTcB 430/440 ms   Echo 12/02/22 1. Left ventricular ejection fraction, by estimation, is 55 to 60%. The  left ventricle has normal function.  The left ventricle has no regional  wall motion abnormalities. There is mild concentric left ventricular  hypertrophy. Left ventricular diastolic parameters are indeterminate.   2. Right ventricular systolic function is normal. The right ventricular  size is normal.   3. The mitral valve is normal in structure. No evidence of mitral valve  regurgitation. No evidence of mitral stenosis.   4. The aortic valve was not well visualized. Aortic valve regurgitation  is not visualized.   Comparison(s): No prior Echocardiogram.    Epic records are reviewed at length today   CHA2DS2-VASc Score = 0  The patient's score is based upon: CHF History: 0 HTN History: 0 Diabetes History: 0 Stroke History: 0 Vascular Disease History: 0 Age Score: 0 Gender Score: 0       ASSESSMENT AND PLAN: Paroxysmal Atrial Fibrillation/atrial flutter (ICD10:  I48.0) The patient's CHA2DS2-VASc score is 0, indicating a 0.2% annual risk of stroke.   Patient appears to be maintaining SR with occasional brief episodes.  Continue flecainide  150 mg BID Continue Lopressor  25 mg BID Not currently on anticoagulation with low CV score. Apple Watch for home monitoring.   High Risk Medication Monitoring (ICD 10: [id number]) Patient requires ongoing monitoring for anti-arrhythmic medication which has the potential to cause life threatening arrhythmias. Intervals on ECG acceptable for flecainide  monitoring.     Obesity Body mass index is 52.88 kg/m.  Encouraged lifestyle modification Patient would like more direction with his weight loss goals. Will refer to the [facility]. May be a candidate for Zepbound with h/o severe OSA.   OSA  Severe on study 12/06/22 Encouraged nightly CPAP   Follow up in the AF clinic in 6 months. Will also request f/u with Dr Santo.    Daril Kicks PA-C Af[facility] [address] [place], [place] [address] [phone] 05/31/2024 9:22 AM

## 2024-06-17 ENCOUNTER — Encounter (INDEPENDENT_AMBULATORY_CARE_PROVIDER_SITE_OTHER): Payer: Self-pay

## 2024-06-28 ENCOUNTER — Other Ambulatory Visit (HOSPITAL_COMMUNITY): Payer: Self-pay | Admitting: *Deleted

## 2024-06-28 MED ORDER — METOPROLOL TARTRATE 25 MG PO TABS: 25.0000 mg | ORAL_TABLET | Freq: Two times a day (BID) | ORAL | 2 refills | Status: AC

## 2024-07-19 ENCOUNTER — Other Ambulatory Visit (HOSPITAL_COMMUNITY): Payer: Self-pay | Admitting: *Deleted

## 2024-07-19 MED ORDER — FLECAINIDE ACETATE 150 MG PO TABS: 150.0000 mg | ORAL_TABLET | Freq: Two times a day (BID) | ORAL | 2 refills | Status: AC

## 2024-09-05 ENCOUNTER — Encounter: Payer: Self-pay | Admitting: Family Medicine

## 2024-10-09 ENCOUNTER — Other Ambulatory Visit: Payer: Self-pay

## 2024-10-09 ENCOUNTER — Encounter (HOSPITAL_COMMUNITY): Payer: Self-pay

## 2024-10-09 ENCOUNTER — Emergency Department (HOSPITAL_COMMUNITY)
Admission: EM | Admit: 2024-10-09 | Discharge: 2024-10-09 | Disposition: A | Discharge status: Home / Self Care | Attending: Emergency Medicine | Admitting: Emergency Medicine

## 2024-10-09 DIAGNOSIS — L03116 Cellulitis of left lower limb: Secondary | ICD-10-CM | POA: Insufficient documentation

## 2024-10-09 DIAGNOSIS — I4891 Unspecified atrial fibrillation: Secondary | ICD-10-CM | POA: Insufficient documentation

## 2024-10-09 DIAGNOSIS — M79605 Pain in left leg: Secondary | ICD-10-CM | POA: Diagnosis present

## 2024-10-09 DIAGNOSIS — Z7901 Long term (current) use of anticoagulants: Secondary | ICD-10-CM | POA: Diagnosis not present

## 2024-10-09 LAB — COMPREHENSIVE METABOLIC PANEL WITH GFR
ALT: 20 U/L (ref 0–44)
AST: 22 U/L (ref 15–41)
Albumin: 3.8 g/dL (ref 3.5–5.0)
Alkaline Phosphatase: 87 U/L (ref 38–126)
Anion gap: 12 (ref 5–15)
BUN: 15 mg/dL (ref 6–20)
CO2: 23 mmol/L (ref 22–32)
Calcium: 8.9 mg/dL (ref 8.9–10.3)
Chloride: 102 mmol/L (ref 98–111)
Creatinine, Ser: 1.06 mg/dL (ref 0.61–1.24)
GFR, Estimated: >60 mL/min
Glucose, Bld: 106 mg/dL — ABNORMAL HIGH (ref 70–99)
Potassium: 3.7 mmol/L (ref 3.5–5.1)
Sodium: 136 mmol/L (ref 135–145)
Total Bilirubin: 0.5 mg/dL (ref 0.0–1.2)
Total Protein: 7.9 g/dL (ref 6.5–8.1)

## 2024-10-09 LAB — CBC WITH DIFFERENTIAL/PLATELET
Abs Immature Granulocytes: 0.05 10*3/uL (ref 0.00–0.07)
Basophils Absolute: 0 10*3/uL (ref 0.0–0.1)
Basophils Relative: 0 %
Eosinophils Absolute: 0.1 10*3/uL (ref 0.0–0.5)
Eosinophils Relative: 1 %
HCT: 48.8 % (ref 39.0–52.0)
Hemoglobin: 16.2 g/dL (ref 13.0–17.0)
Immature Granulocytes: 0 %
Lymphocytes Relative: 13 %
Lymphs Abs: 1.6 10*3/uL (ref 0.7–4.0)
MCH: 28.9 pg (ref 26.0–34.0)
MCHC: 33.2 g/dL (ref 30.0–36.0)
MCV: 87 fL (ref 80.0–100.0)
Monocytes Absolute: 1.1 10*3/uL — ABNORMAL HIGH (ref 0.1–1.0)
Monocytes Relative: 10 %
Neutro Abs: 8.9 10*3/uL — ABNORMAL HIGH (ref 1.7–7.7)
Neutrophils Relative %: 76 %
Platelets: 213 10*3/uL (ref 150–400)
RBC: 5.61 MIL/uL (ref 4.22–5.81)
RDW: 14.4 % (ref 11.5–15.5)
WBC: 11.7 10*3/uL — ABNORMAL HIGH (ref 4.0–10.5)
nRBC: 0 % (ref 0.0–0.2)

## 2024-10-09 LAB — URINALYSIS, W/ REFLEX TO CULTURE (INFECTION SUSPECTED)
Bacteria, UA: NONE SEEN
Bilirubin Urine: NEGATIVE
Glucose, UA: NEGATIVE mg/dL
Ketones, ur: NEGATIVE mg/dL
Leukocytes,Ua: NEGATIVE
Nitrite: NEGATIVE
Protein, ur: 100 mg/dL — AB
Specific Gravity, Urine: 1.02 (ref 1.005–1.030)
pH: 5 (ref 5.0–8.0)

## 2024-10-09 LAB — I-STAT CG4 LACTIC ACID, ED: Lactic Acid, Venous: 1.6 mmol/L (ref 0.5–1.9)

## 2024-10-09 MED ORDER — DOXYCYCLINE HYCLATE 100 MG PO TABS
100.0000 mg | ORAL_TABLET | Freq: Once | ORAL | Status: AC
  Administered 2024-10-09: 100 mg via ORAL
  Filled 2024-10-09: qty 1

## 2024-10-09 MED ORDER — SODIUM CHLORIDE 0.9 % IV SOLN
1.0000 g | Freq: Once | INTRAVENOUS | Status: AC
  Administered 2024-10-09: 1 g via INTRAVENOUS
  Filled 2024-10-09: qty 10

## 2024-10-09 MED ORDER — DOXYCYCLINE HYCLATE 100 MG PO CAPS: 100.0000 mg | ORAL_CAPSULE | Freq: Two times a day (BID) | ORAL | 0 refills | Status: AC

## 2024-10-09 NOTE — ED Triage Notes (Signed)
 Pt came in via POV from a follow-up appointment he had with a walk-in clinic d/t cellulitis that has not improved with the ABTs he has been on & encouraged him to come in for eval.

## 2024-10-09 NOTE — ED Provider Notes (Signed)
 " [place] EMERGENCY DEPARTMENT AT [facility] Provider Note   CSN: [id number] Arrival date & time: 10/09/24  9046     Patient presents with: Cellulitis   Bruce Marshall is a 49 y.o. male.  {Add pertinent medical, surgical, social history, OB history to HPI:9085} 49 year old male history of atrial fibrillation who presents to the emergency department with left lower extremity pain and redness.  Was scratched by his dog on his left leg a week ago.  2 days afterwards started having some redness and developing pain in his left leg.  Had a fever of 102 and went to urgent care on 10/07/2024.  Was started on Augmentin after being given a shot of ceftriaxone  there.  Went for follow-up today and reports that his fevers and chills have improved but the redness spreads somewhat from the marked areas and they recommended that he come into the emergency department for evaluation       Prior to Admission medications  Medication Sig Start Date End Date Taking? Authorizing Provider  apixaban  (ELIQUIS ) 5 MG TABS tablet Take 1 tablet (5 mg total) by mouth 2 (two) times daily. Patient not taking: Reported on 05/31/2024 02/24/23   Terra Fairy PARAS, PA-C  flecainide  (TAMBOCOR ) 150 MG tablet Take 1 tablet (150 mg total) by mouth 2 (two) times daily. 07/19/24   Fenton, Clint R, PA  furosemide  (LASIX ) 20 MG tablet Take 1 tablet (20 mg total) by mouth daily as needed (leg swelling). Patient not taking: Reported on 05/31/2024 12/09/22   Santo Stanly LABOR, MD  metoprolol  tartrate (LOPRESSOR ) 25 MG tablet Take 1 tablet (25 mg total) by mouth 2 (two) times daily. 06/28/24   Fenton, Clint R, PA    Allergies: Patient has no known allergies.    Review of Systems  Updated Vital Signs BP 135/81 (BP Location: Right Arm)   Pulse 81   Temp 98.2 F (36.8 C) (Oral)   Resp (!) 22   Ht 5' 6 (1.676 m)   Wt (!) 147.4 kg   SpO2 100%   BMI 52.46 kg/m   Physical Exam Vitals and nursing note reviewed.   Constitutional:      General: He is not in acute distress.    Appearance: He is well-developed.  HENT:     Head: Normocephalic and atraumatic.     Right Ear: External ear normal.     Left Ear: External ear normal.     Nose: Nose normal.  Cardiovascular:     Rate and Rhythm: Normal rate and regular rhythm.     Heart sounds: Normal heart sounds.     Comments: DP pulses 2+ bilaterally Pulmonary:     Effort: Pulmonary effort is normal. No respiratory distress.  Abdominal:     Palpations: Abdomen is soft.  Musculoskeletal:     Right lower leg: Edema present.     Left lower leg: Edema present.     Comments: Small 1.5 cm open wound on the medial aspect of the left lower extremity.  Erythema circumferential around the left lower extremity.  No subcutaneous emphysema or crepitance.  Skin:    General: Skin is warm and dry.  Neurological:     Mental Status: He is alert. Mental status is at baseline.  Psychiatric:        Mood and Affect: Mood normal.        Behavior: Behavior normal.     (all labs ordered are listed, but only abnormal results are displayed) Labs Reviewed  CBC WITH DIFFERENTIAL/PLATELET - Abnormal; Notable for the following components:      Result Value   WBC 11.7 (*)    Neutro Abs 8.9 (*)    Monocytes Absolute 1.1 (*)    All other components within normal limits  URINALYSIS, W/ REFLEX TO CULTURE (INFECTION SUSPECTED) - Abnormal; Notable for the following components:   APPearance HAZY (*)    Hgb urine dipstick LARGE (*)    Protein, ur 100 (*)    All other components within normal limits  COMPREHENSIVE METABOLIC PANEL WITH GFR  I-STAT CG4 LACTIC ACID, ED    EKG: None  Radiology: No results found.  {Document cardiac monitor, telemetry assessment procedure when appropriate:32947} Procedures   Medications Ordered in the ED - No data to display    {Click here for ABCD2, HEART and other calculators REFRESH Note before signing:1}                               Medical Decision Making Amount and/or Complexity of Data Reviewed Labs: ordered.  Risk Prescription drug management.   ***  {Document critical care time when appropriate  Document review of labs and clinical decision tools ie CHADS2VASC2, etc  Document your independent review of radiology images and any outside records  Document your discussion with family members, caretakers and with consultants  Document social determinants of health affecting pt's care  Document your decision making why or why not admission, treatments were needed:32947:::1}   Final diagnoses:  None    ED Discharge Orders     None        "

## 2024-10-09 NOTE — Discharge Instructions (Signed)
 You were seen for skin infection (cellulitis) in the emergency department.   At home, please take the antibiotics (doxycycline  and Augmentin) we have prescribed you.  You may also take tylenol  and ibuprofen for any pain that you have.   Check your MyChart online for the results of any tests that had not resulted by the time you left the emergency department.   Follow-up with your primary doctor in 2-3 days regarding your visit.    Return immediately to the emergency department if you experience any of the following: fevers, severe pain, rapid spread of the rash/redness, or any other concerning symptoms.    Thank you for visiting our Emergency Department. It was a pleasure taking care of you today.

## 2024-10-09 NOTE — ED Triage Notes (Signed)
 Lower left leg is red, swollen and painful. He has had a wound on his leg for the past week to week and a half. He states his dog scratched him. He has been on orak antibiotics since Thursday, and he also states he had a shot with antibiotics on Thursday as well. Denies fever since starting antibiotics.

## 2024-10-15 ENCOUNTER — Other Ambulatory Visit: Payer: Self-pay

## 2024-10-15 ENCOUNTER — Inpatient Hospital Stay (HOSPITAL_COMMUNITY)
Admission: EM | Admit: 2024-10-15 | Source: Home / Self Care | Attending: Internal Medicine | Admitting: Internal Medicine

## 2024-10-15 ENCOUNTER — Emergency Department (HOSPITAL_COMMUNITY)

## 2024-10-15 DIAGNOSIS — I10 Essential (primary) hypertension: Secondary | ICD-10-CM

## 2024-10-15 DIAGNOSIS — I483 Typical atrial flutter: Secondary | ICD-10-CM

## 2024-10-15 DIAGNOSIS — I480 Paroxysmal atrial fibrillation: Secondary | ICD-10-CM | POA: Diagnosis present

## 2024-10-15 DIAGNOSIS — G4733 Obstructive sleep apnea (adult) (pediatric): Secondary | ICD-10-CM

## 2024-10-15 DIAGNOSIS — N182 Chronic kidney disease, stage 2 (mild): Secondary | ICD-10-CM

## 2024-10-15 DIAGNOSIS — R7989 Other specified abnormal findings of blood chemistry: Secondary | ICD-10-CM

## 2024-10-15 DIAGNOSIS — L0390 Cellulitis, unspecified: Principal | ICD-10-CM

## 2024-10-15 DIAGNOSIS — N189 Chronic kidney disease, unspecified: Secondary | ICD-10-CM

## 2024-10-15 DIAGNOSIS — L03116 Cellulitis of left lower limb: Secondary | ICD-10-CM

## 2024-10-15 LAB — BASIC METABOLIC PANEL WITH GFR
Anion gap: 11 (ref 5–15)
BUN: 45 mg/dL — ABNORMAL HIGH (ref 6–20)
CO2: 22 mmol/L (ref 22–32)
Calcium: 8.2 mg/dL — ABNORMAL LOW (ref 8.9–10.3)
Chloride: 103 mmol/L (ref 98–111)
Creatinine, Ser: 2.04 mg/dL — ABNORMAL HIGH (ref 0.61–1.24)
GFR, Estimated: 39 mL/min — ABNORMAL LOW
Glucose, Bld: 107 mg/dL — ABNORMAL HIGH (ref 70–99)
Potassium: 3.8 mmol/L (ref 3.5–5.1)
Sodium: 137 mmol/L (ref 135–145)

## 2024-10-15 LAB — CBC
HCT: 41.5 % (ref 39.0–52.0)
Hemoglobin: 13.7 g/dL (ref 13.0–17.0)
MCH: 28.5 pg (ref 26.0–34.0)
MCHC: 33 g/dL (ref 30.0–36.0)
MCV: 86.3 fL (ref 80.0–100.0)
Platelets: 258 10*3/uL (ref 150–400)
RBC: 4.81 MIL/uL (ref 4.22–5.81)
RDW: 14 % (ref 11.5–15.5)
WBC: 8.7 10*3/uL (ref 4.0–10.5)
nRBC: 0 % (ref 0.0–0.2)

## 2024-10-15 LAB — PRO BRAIN NATRIURETIC PEPTIDE: Pro Brain Natriuretic Peptide: 1393 pg/mL — ABNORMAL HIGH

## 2024-10-15 LAB — I-STAT CG4 LACTIC ACID, ED: Lactic Acid, Venous: 0.8 mmol/L (ref 0.5–1.9)

## 2024-10-15 NOTE — ED Triage Notes (Addendum)
 Patient endorses shortness of breath x 1 day. Patient endorses left leg swelling and redness. Patient was given abx recently. Patient endorses increased swelling that worsens throughout the day. Alert oriented ambulatory. Patient endorses urine is dark in color after taking abx. Patients left leg is red and swollen in nature at EMS triage. Patient endorses swelling in left leg x 2 years and redness x1 week ago.

## 2024-10-15 NOTE — ED Notes (Signed)
 Pt did not answer when called for vitals check

## 2024-10-15 NOTE — ED Triage Notes (Addendum)
 Pt c/o SHOB that started last night. Also recently seen for redness/swelling to left foot and given 2 antibiotics and told to return if the redness and swelling worsened.

## 2024-10-16 ENCOUNTER — Emergency Department (HOSPITAL_COMMUNITY)

## 2024-10-16 ENCOUNTER — Encounter (HOSPITAL_COMMUNITY): Payer: Self-pay | Admitting: Internal Medicine

## 2024-10-16 DIAGNOSIS — I10 Essential (primary) hypertension: Secondary | ICD-10-CM

## 2024-10-16 DIAGNOSIS — L03116 Cellulitis of left lower limb: Secondary | ICD-10-CM | POA: Diagnosis not present

## 2024-10-16 DIAGNOSIS — N182 Chronic kidney disease, stage 2 (mild): Secondary | ICD-10-CM | POA: Diagnosis not present

## 2024-10-16 DIAGNOSIS — N179 Acute kidney failure, unspecified: Secondary | ICD-10-CM

## 2024-10-16 DIAGNOSIS — I480 Paroxysmal atrial fibrillation: Secondary | ICD-10-CM | POA: Diagnosis not present

## 2024-10-16 DIAGNOSIS — N189 Chronic kidney disease, unspecified: Secondary | ICD-10-CM

## 2024-10-16 DIAGNOSIS — R7989 Other specified abnormal findings of blood chemistry: Secondary | ICD-10-CM | POA: Diagnosis not present

## 2024-10-16 DIAGNOSIS — G4733 Obstructive sleep apnea (adult) (pediatric): Secondary | ICD-10-CM

## 2024-10-16 LAB — URINALYSIS, ROUTINE W REFLEX MICROSCOPIC
Bilirubin Urine: NEGATIVE
Glucose, UA: NEGATIVE mg/dL
Ketones, ur: NEGATIVE mg/dL
Nitrite: NEGATIVE
Protein, ur: >=300 mg/dL — AB
RBC / HPF: >50 RBC/hpf (ref 0–5)
Specific Gravity, Urine: 1.013 (ref 1.005–1.030)
WBC, UA: >50 WBC/hpf (ref 0–5)
pH: 5 (ref 5.0–8.0)

## 2024-10-16 LAB — SODIUM, URINE, RANDOM: Sodium, Ur: <30 mmol/L

## 2024-10-16 LAB — CREATININE, URINE, RANDOM: Creatinine, Urine: 134 mg/dL

## 2024-10-16 LAB — HIV ANTIBODY (ROUTINE TESTING W REFLEX): HIV Screen 4th Generation wRfx: NONREACTIVE

## 2024-10-16 MED ORDER — ONDANSETRON HCL 4 MG/2ML IJ SOLN: 4.0000 mg | Freq: Four times a day (QID) | INTRAMUSCULAR | Status: AC | PRN

## 2024-10-16 MED ORDER — METOPROLOL TARTRATE 25 MG PO TABS
25.0000 mg | ORAL_TABLET | Freq: Two times a day (BID) | ORAL | Status: AC
  Administered 2024-10-16 – 2024-10-22 (×14): 25 mg via ORAL
  Filled 2024-10-16 (×14): qty 1

## 2024-10-16 MED ORDER — LINEZOLID 600 MG/300ML IV SOLN
600.0000 mg | Freq: Two times a day (BID) | INTRAVENOUS | Status: DC
  Administered 2024-10-16: 600 mg via INTRAVENOUS
  Filled 2024-10-16 (×2): qty 300

## 2024-10-16 MED ORDER — ACETAMINOPHEN 325 MG PO TABS: 650.0000 mg | ORAL_TABLET | Freq: Four times a day (QID) | ORAL | Status: DC | PRN

## 2024-10-16 MED ORDER — FLECAINIDE ACETATE 50 MG PO TABS
150.0000 mg | ORAL_TABLET | Freq: Two times a day (BID) | ORAL | Status: AC
  Administered 2024-10-16 – 2024-10-22 (×14): 150 mg via ORAL
  Filled 2024-10-16 (×16): qty 1

## 2024-10-16 MED ORDER — SODIUM CHLORIDE 0.9 % IV SOLN
2.0000 g | Freq: Once | INTRAVENOUS | Status: AC
  Administered 2024-10-16: 2 g via INTRAVENOUS
  Filled 2024-10-16: qty 20

## 2024-10-16 MED ORDER — VANCOMYCIN HCL 10 G IV SOLR
2500.0000 mg | Freq: Once | INTRAVENOUS | Status: AC
  Administered 2024-10-16: 2500 mg via INTRAVENOUS
  Filled 2024-10-16: qty 25

## 2024-10-16 MED ORDER — SODIUM CHLORIDE 0.9% FLUSH
3.0000 mL | Freq: Two times a day (BID) | INTRAVENOUS | Status: AC
  Administered 2024-10-16: 3 mL via INTRAVENOUS
  Administered 2024-10-16: 20 mL via INTRAVENOUS
  Administered 2024-10-17 – 2024-10-22 (×11): 3 mL via INTRAVENOUS

## 2024-10-16 MED ORDER — SODIUM CHLORIDE 0.9 % IV SOLN
2.0000 g | INTRAVENOUS | Status: DC
  Administered 2024-10-17 – 2024-10-22 (×6): 2 g via INTRAVENOUS
  Filled 2024-10-16 (×6): qty 20

## 2024-10-16 MED ORDER — ACETAMINOPHEN 650 MG RE SUPP: 650.0000 mg | Freq: Four times a day (QID) | RECTAL | Status: DC | PRN

## 2024-10-16 MED ORDER — ENOXAPARIN SODIUM 60 MG/0.6ML IJ SOSY
60.0000 mg | PREFILLED_SYRINGE | INTRAMUSCULAR | Status: DC
  Administered 2024-10-16 – 2024-10-18 (×3): 60 mg via SUBCUTANEOUS
  Filled 2024-10-16 (×3): qty 0.6

## 2024-10-16 MED ORDER — SODIUM CHLORIDE 0.9 % IV BOLUS
1000.0000 mL | Freq: Once | INTRAVENOUS | Status: AC
  Administered 2024-10-16: 1000 mL via INTRAVENOUS

## 2024-10-16 MED ORDER — SODIUM CHLORIDE 0.9 % IV SOLN: 250.0000 mL | INTRAVENOUS | Status: AC | PRN

## 2024-10-16 MED ORDER — SODIUM CHLORIDE 0.9% FLUSH: 3.0000 mL | INTRAVENOUS | Status: AC | PRN

## 2024-10-16 MED ORDER — ONDANSETRON HCL 4 MG PO TABS: 4.0000 mg | ORAL_TABLET | Freq: Four times a day (QID) | ORAL | Status: AC | PRN

## 2024-10-16 MED ORDER — SODIUM CHLORIDE 0.9% FLUSH
3.0000 mL | Freq: Two times a day (BID) | INTRAVENOUS | Status: AC
  Administered 2024-10-16 – 2024-10-22 (×13): 3 mL via INTRAVENOUS

## 2024-10-16 NOTE — Assessment & Plan Note (Addendum)
 10/16/24 failed outpatient augmentin. Pt states he gets scratched by his dog's nails. On IV zyvox  and IV rocephin . Will check Left LE U/S to r/o DVT.  Right leg Left leg

## 2024-10-16 NOTE — Hospital Course (Signed)
 CC: left leg swelling and redness HPI: Bruce Marshall is a 49 y.o. male with medical history significant of paroxysmal atrial fibrillation-not on anticoagulation due to low CHA2DS2-VASc score is 0, obstructive sleep apnea on CPAP at home, CKD stage II presented to emergency department complaining of leg swelling and worsening redness of the left lower extremity.  Patient reported he was diagnosed with cellulitis and started on antibiotic have been taking for last 2 weeks without improvement of the swelling and redness.  Redness and rash is spreading beyond the area.   Patient reported fever last Wednesday however denies any chills.  patient also reporting feeling short of breath with ambulation.  Denies orthopnea, PND, chest pain and palpitation.  Per patient he has chronic bilateral lower extremities dependent edema which is chronic in nature without any new changes.   Patient reported he use CPAP machine at home however he not able using it for last 1 month as he left it at [place]  and going back to [place]  in February.   ED Course:  At presentation to ED patient is hemodynamically stable.  Afebrile.   Lab work, CBC unremarkable.  BMP showing elevated BUN 45, elevated creatinine 2.04. Elevated proBNP 1393.   EKG showing normal sinus rhythm heart rate 76.   Chest x-ray no active disease process.   CT of the left lower extremity evidence for cellulitis. IMPRESSION: 1. Diffuse subcutaneous edema and swelling in the lower leg ;correlate clinically for cellulitis. No evidence for abscess , deep fascial plane edema or soft tissue air.   In the ED patient received 1 L of NS bolus, vancomycin  and ceftriaxone .   Hospitalist consulted for further evaluation management for left lower extremity cellulitis and AKI.  Significant Events: Admitted 10/15/2024 left leg cellulitis and AKI 10-17-2024 left LE U/S negative for DVT 10-18-2024 nephrology consulted for worsening AKI, cards consulted for  SOB/elevated BNP 10-19-2024 nephrology planning on renal biopsy on 10-20-2024  Admission Labs: WBC 8.7, HgB 13.7, plt 258 proBNP 1393 Na 137, K 3.8, CO2 of 22, BUN 45, Scr 2.04, glu 107 UA amber/cloudy, Lg HgB, negative nitrite, moderate LE, WBC >50, many bacteria  Admission Imaging Studies: CXR No acute process.  CT left tib/fib Diffuse subcutaneous edema and swelling in the lower leg ;correlate clinically for cellulitis. No evidence for abscess , deep fascial plane edema or soft tissue air. Echo shows normal LVEF. No diastolic dysfunction.  Significant Labs: ASO 33 Complement C3 of 60  Significant Imaging Studies: Renal U/S  No hydronephrosis. 2. Increased hepatic echogenicity as can be seen in hepatic steatosis Echo LVEF 55-60%. No diastolic dysfunction Left LE U/S negative for DVT  Antibiotic Therapy: Anti-infectives (From admission, onward)    Start     Dose/Rate Route Frequency Ordered Stop   10/17/24 0000  cefTRIAXone  (ROCEPHIN ) 2 g in sodium chloride  0.9 % 100 mL IVPB        2 g 200 mL/hr over 30 Minutes Intravenous Every 24 hours 10/16/24 0509     10/16/24 2200  linezolid  (ZYVOX ) IVPB 600 mg        600 mg 300 mL/hr over 60 Minutes Intravenous Every 12 hours 10/16/24 0542     10/16/24 0145  cefTRIAXone  (ROCEPHIN ) 2 g in sodium chloride  0.9 % 100 mL IVPB        2 g 200 mL/hr over 30 Minutes Intravenous  Once 10/16/24 0130 10/16/24 0437   10/16/24 0145  vancomycin  (VANCOCIN ) 2,500 mg in sodium chloride  0.9 % 500 mL IVPB  2,500 mg 262.5 mL/hr over 120 Minutes Intravenous  Once 10/16/24 0132 10/16/24 0443       Procedures:   Consultants: Nephrology cardiology

## 2024-10-16 NOTE — Assessment & Plan Note (Signed)
 10/16/24 Baseline scr around 1.3  10/17/24 AKI on CKD stage 2. Scr up to 2.4 today. Will give IV lasix . Repeat BMP in AM.  10/18/24 worsening AKI. Nephrology consulted.  10/19/24 see AKI assessment and plan

## 2024-10-16 NOTE — Assessment & Plan Note (Signed)
Body mass index is 52.31 kg/m.

## 2024-10-16 NOTE — ED Notes (Signed)
 Rn assumes care of this pt at 92

## 2024-10-16 NOTE — Assessment & Plan Note (Signed)
 10/16/24 echo pending.  10/17/24 awaiting echo. Give 1 dose IV lasix  today.  10/18/24 echo with normal LVEF. No diastolic dysfunction. Cards consulted.  10/19/24 no further workup per cards consult. They have signed off.

## 2024-10-16 NOTE — Plan of Care (Signed)

## 2024-10-16 NOTE — Assessment & Plan Note (Signed)
 10/16/24 continue with lopressor  25 mg bid  10/17/24 stable on lopressor  25 mg bid.  10/18/24 stable BP on lopressor  25 mg bid.  10/19/24 on lopressor  25 mg bid.

## 2024-10-16 NOTE — Assessment & Plan Note (Addendum)
 10/16/24 admitted with Scr 2.04. just admitted earlier this AM.  BMP tomorrow.  10/17/24 Scr up to 2.4 today with BUN 52. Pt c/o of SOB last night. Will give 1 dose of IV lasix . Pt states his urine is no longer brown but clear yellow now. Repeat BMP in AM. No RBC casts on UA. Although he had large HgB on UA. ??strep glomerularnephritis?? Check ASO and C3 levels.  10/18/24 worsening Scr of 3.13 BUN 60. Only had 1 dose of IV lasix  40 mg yesterday. Will consult nephrology for assistance.  10/19/24 renal function continues to decline. Nephrology planning on renal biopsy tomorrow. Pt may need temporary dialysis. Pt is aware.

## 2024-10-16 NOTE — Subjective & Objective (Signed)
 Pt seen and examined. Pt states his urine has changed back to normal yellow in color. Sitting up at edge of bed with legs in dependent position. No pain. Still awaiting LE U/S to r/o DVT. Had some difficulty lying flat last night to sleep. He slept in recliner.

## 2024-10-16 NOTE — Progress Notes (Addendum)
 " PROGRESS NOTE    Bruce Marshall  FMW:[id number] DOB: June 13, 1976 DOA: 10/15/2024 PCP: Tanda Bleacher, MD  Subjective: Pt seen and examined. Minimal pain in left leg when at rest. Does hurt when he hangs his leg in dependent position. Thinks he injured his left leg due to dog nail scratches. Failed outpatient abx keflex and augmentin. On IV rocephin  and po zyvox .   Hospital Course: CC: left leg swelling and redness HPI: Bruce Marshall is a 49 y.o. male with medical history significant of paroxysmal atrial fibrillation-not on anticoagulation due to low CHA2DS2-VASc score is 0, obstructive sleep apnea on CPAP at home, CKD stage II presented to emergency department complaining of leg swelling and worsening redness of the left lower extremity.  Patient reported he was diagnosed with cellulitis and started on antibiotic have been taking for last 2 weeks without improvement of the swelling and redness.  Redness and rash is spreading beyond the area.   Patient reported fever last Wednesday however denies any chills.  patient also reporting feeling short of breath with ambulation.  Denies orthopnea, PND, chest pain and palpitation.  Per patient he has chronic bilateral lower extremities dependent edema which is chronic in nature without any new changes.   Patient reported he use CPAP machine at home however he not able using it for last 1 month as he left it at [place]  and going back to [place]  in February.   ED Course:  At presentation to ED patient is hemodynamically stable.  Afebrile.   Lab work, CBC unremarkable.  BMP showing elevated BUN 45, elevated creatinine 2.04. Elevated proBNP 1393.   EKG showing normal sinus rhythm heart rate 76.   Chest x-ray no active disease process.   CT of the left lower extremity evidence for cellulitis. IMPRESSION: 1. Diffuse subcutaneous edema and swelling in the lower leg ;correlate clinically for cellulitis. No evidence for abscess , deep fascial plane  edema or soft tissue air.   In the ED patient received 1 L of NS bolus, vancomycin  and ceftriaxone .   Hospitalist consulted for further evaluation management for left lower extremity cellulitis and AKI.  Significant Events: Admitted 10/15/2024 left leg cellulitis and AKI   Admission Labs: WBC 8.7, HgB 13.7, plt 258 proBNP 1393 Na 137, K 3.8, CO2 of 22, BUN 45, Scr 2.04, glu 107 UA amber/cloudy, Lg HgB, negative nitrite, moderate LE, WBC >50, many bacteria  Admission Imaging Studies: CXR No acute process.  CT left tib/fib Diffuse subcutaneous edema and swelling in the lower leg ;correlate clinically for cellulitis. No evidence for abscess , deep fascial plane edema or soft tissue air.  Significant Labs:   Significant Imaging Studies:   Antibiotic Therapy: Anti-infectives (From admission, onward)    Start     Dose/Rate Route Frequency Ordered Stop   10/17/24 0000  cefTRIAXone  (ROCEPHIN ) 2 g in sodium chloride  0.9 % 100 mL IVPB        2 g 200 mL/hr over 30 Minutes Intravenous Every 24 hours 10/16/24 0509     10/16/24 2200  linezolid  (ZYVOX ) IVPB 600 mg        600 mg 300 mL/hr over 60 Minutes Intravenous Every 12 hours 10/16/24 0542     10/16/24 0145  cefTRIAXone  (ROCEPHIN ) 2 g in sodium chloride  0.9 % 100 mL IVPB        2 g 200 mL/hr over 30 Minutes Intravenous  Once 10/16/24 0130 10/16/24 0437   10/16/24 0145  vancomycin  (VANCOCIN ) 2,500 mg in sodium chloride  0.9 %  500 mL IVPB        2,500 mg 262.5 mL/hr over 120 Minutes Intravenous  Once 10/16/24 0132 10/16/24 0443       Procedures:   Consultants:     Assessment and Plan: * Cellulitis of left lower extremity 10/16/24 failed outpatient augmentin. Pt states he gets scratched by his dog's nails. On IV zyvox  and IV rocephin . Will check Left LE U/S to r/o DVT.  Right leg Left leg         Acute kidney injury superimposed on chronic kidney disease 10/16/24 admitted with Scr 2.04. just admitted earlier this AM.   BMP tomorrow.   Elevated brain natriuretic peptide (BNP) level 10/16/24 echo pending.   CKD (chronic kidney disease), stage II - Baseline scr around 1.3 10/16/24 Baseline scr around 1.3  Essential hypertension 10/16/24 continue with lopressor  25 mg bid   Morbid obesity with BMI of 50.0-59.9, adult (HCC) Body mass index is 52.31 kg/m.   Paroxysmal atrial fibrillation (HCC) 10/16/24 continue tambocor  and lopressor .   DVT prophylaxis: SCDs Start: 10/16/24 0510 Place TED hose Start: 10/16/24 0510    Code Status: Full Code Family Communication: no family at bedside. Pt is decisional Disposition Plan: return home Reason for continuing need for hospitalization: remains on IV ABX.  Objective: Vitals:   10/16/24 0200 10/16/24 0400 10/16/24 0741 10/16/24 1520  BP: 131/74 105/61 136/76 118/71  Pulse: 70 71 68 64  Resp: 18 16 20 18   Temp:  99.1 F (37.3 C)  97.8 F (36.6 C)  TempSrc:  Oral  Oral  SpO2: 100% 92% 97% 97%  Weight:      Height:       No intake or output data in the 24 hours ending 10/16/24 1658 Filed Weights   10/15/24 1819  Weight: (!) 147 kg   Examination:  Physical Exam Vitals and nursing note reviewed.  Constitutional:      General: He is not in acute distress.    Appearance: He is obese. He is not toxic-appearing or diaphoretic.  HENT:     Head: Normocephalic and atraumatic.     Nose: Nose normal.  Eyes:     General: No scleral icterus. Cardiovascular:     Rate and Rhythm: Normal rate and regular rhythm.  Pulmonary:     Effort: Pulmonary effort is normal.  Abdominal:     General: Abdomen is protuberant. Bowel sounds are normal.     Palpations: Abdomen is soft.  Musculoskeletal:     Left lower leg: Edema present.  Skin:    General: Skin is warm and dry.     Capillary Refill: Capillary refill takes less than 2 seconds.     Findings: Erythema present.     Comments: See pictures of both legs  Neurological:     Mental Status: He is alert  and oriented to person, place, and time.     Right leg Left leg      Data Reviewed: I have personally reviewed following labs and imaging studies  CBC: Recent Labs  Lab 10/15/24 1830  WBC 8.7  HGB 13.7  HCT 41.5  MCV 86.3  PLT 258   Basic Metabolic Panel: Recent Labs  Lab 10/15/24 1830  NA 137  K 3.8  CL 103  CO2 22  GLUCOSE 107*  BUN 45*  CREATININE 2.04*  CALCIUM 8.2*   GFR: Estimated Creatinine Clearance: 58.8 mL/min (A) (by C-G formula based on SCr of 2.04 mg/dL (H)). ProBNP, BNP (last 5 results) Recent  Labs    10/15/24 1830  PROBNP 1,393.0*   Sepsis Labs: Recent Labs  Lab 10/15/24 1836  LATICACIDVEN 0.8    Recent Results (from the past 240 hours)  Culture, blood (Routine X 2) w Reflex to ID Panel     Status: None (Preliminary result)   Collection Time: 10/16/24  5:39 AM   Specimen: BLOOD  Result Value Ref Range Status   Specimen Description BLOOD SITE NOT SPECIFIED  Final   Special Requests   Final    BOTTLES DRAWN AEROBIC AND ANAEROBIC Blood Culture results may not be optimal due to an inadequate volume of blood received in culture bottles   Culture   Final    NO GROWTH < 12 HOURS Performed at [facility], [address] [place], [place] [address]    Report Status PENDING  Incomplete  Culture, blood (Routine X 2) w Reflex to ID Panel     Status: None (Preliminary result)   Collection Time: 10/16/24  5:39 AM   Specimen: BLOOD  Result Value Ref Range Status   Specimen Description BLOOD SITE NOT SPECIFIED  Final   Special Requests   Final    BOTTLES DRAWN AEROBIC AND ANAEROBIC Blood Culture results may not be optimal due to an inadequate volume of blood received in culture bottles   Culture   Final    NO GROWTH < 12 HOURS Performed at [facility], [address] [place], [place] [address]    Report Status PENDING  Incomplete     Radiology Studies: CT Tibia Fibula Left Wo Contrast Result Date: 10/16/2024 EXAM: CT LEFT LOWER  EXTREMITY, WITHOUT IV CONTRAST 10/16/2024 01:48:05 AM TECHNIQUE: Axial images were acquired through the left lower extremity without IV contrast. Reformatted images were reviewed. Automated exposure control, iterative reconstruction, and/or weight based adjustment of the mA/kV was utilized to reduce the radiation dose to as low as reasonably achievable. COMPARISON: None available. CLINICAL HISTORY: Soft tissue infection suspected, lower leg, xray done. Suspected soft tissue infection of the lower leg; X-ray done. FINDINGS: BONES AND JOINTS: No acute fracture or focal osseous lesion. No dislocation. The joint spaces are normal. SOFT TISSUES: There is diffuse subcutaneous edema and swelling. There is no evidence for soft tissue gas. Deep fascial planes are well preserved. There is no fluid collection or foreign body identified. IMPRESSION: 1. Diffuse subcutaneous edema and swelling in the lower leg ;correlate clinically for cellulitis. No evidence for abscess , deep fascial plane edema or soft tissue air. Electronically signed by: Greig Pique MD 10/16/2024 03:47 AM EST RP Workstation: [id number]   DG Chest 2 View Result Date: 10/15/2024 EXAM: 2 VIEW(S) XRAY OF THE CHEST 10/15/2024 06:57:00 PM COMPARISON: 07/14/2023. CLINICAL HISTORY: shob FINDINGS: LUNGS AND PLEURA: No focal pulmonary opacity. No pleural effusion. No pneumothorax. HEART AND MEDIASTINUM: No acute abnormality of the cardiac and mediastinal silhouettes. BONES AND SOFT TISSUES: No acute osseous abnormality. IMPRESSION: 1. No acute process. Electronically signed by: Greig Pique MD 10/15/2024 08:17 PM EST RP Workstation: [id number]    Scheduled Meds:  enoxaparin  (LOVENOX ) injection  60 mg Subcutaneous Q24H   flecainide   150 mg Oral BID   metoprolol  tartrate  25 mg Oral BID   sodium chloride  flush  3 mL Intravenous Q12H   sodium chloride  flush  3 mL Intravenous Q12H   Continuous Infusions:  sodium chloride      [START ON 10/17/2024] cefTRIAXone   (ROCEPHIN )  IV     linezolid  (ZYVOX ) IV  LOS: 0 days   Time spent: 60 minutes  Camellia Door, DO  [facility]  10/16/2024, 4:58 PM  "

## 2024-10-16 NOTE — ED Provider Notes (Signed)
 " [facility]-EMERGENCY DEPT [facility] Emergency Department Provider Note MRN:  [id number]  Arrival date & time: 10/16/24     Chief Complaint   Shortness of Breath and Leg Swelling   History of Present Illness   Bruce Marshall is a 49 y.o. year-old male with a history of A-fib, hypertension presenting to the ED with chief complaint of leg swelling.  Worsening swelling and redness to the left lower leg.  Was diagnosed with cellulitis and started on antibiotics.  He is almost finished all of the antibiotics but over the past day or 2 it is getting worse.  The redness and rash is spreading far beyond the marked area.  Denies fever.  Was feeling a bit short of breath with ambulating earlier today.  Denies any chest pain.  Review of Systems  A thorough review of systems was obtained and all systems are negative except as noted in the HPI and PMH.   Patient's Health History    Past Medical History:  Diagnosis Date   A-fib [facility])    Chest pain    Edema    lower extremitiy   Groin pain    Hypertension    borderline   Sleep apnea    no CPAP    Past Surgical History:  Procedure Laterality Date   CHOLECYSTECTOMY N/A 09/23/2023   Procedure: LAPAROSCOPIC CHOLECYSTECTOMY;  Surgeon: Polly Cordella LABOR, MD;  Location: [facility] OR;  Service: General;  Laterality: N/A;   COLONOSCOPY WITH PROPOFOL  N/A 12/19/2022   Procedure: COLONOSCOPY WITH PROPOFOL ;  Surgeon: Albertus Gordy HERO, MD;  Location: [facility] ENDOSCOPY;  Service: Gastroenterology;  Laterality: N/A;   PILONIDAL CYST DRAINAGE     POLYPECTOMY  12/19/2022   Procedure: POLYPECTOMY;  Surgeon: Albertus Gordy HERO, MD;  Location: [facility] ENDOSCOPY;  Service: Gastroenterology;;   WRIST SURGERY Right     Family History  Problem Relation Age of Onset   Healthy Daughter    Heart attack Maternal Uncle    Cancer Neg Hx    Stroke Neg Hx    Colon cancer Neg Hx    Esophageal cancer Neg Hx     Social History   Socioeconomic History   Marital status: Married    Spouse  name: Not on file   Number of children: Not on file   Years of education: Not on file   Highest education level: Not on file  Occupational History   Not on file  Tobacco Use   Smoking status: Never   Smokeless tobacco: Never   Tobacco comments:    Never smoke 11/06/22  Vaping Use   Vaping status: Never Used  Substance and Sexual Activity   Alcohol use: Not Currently   Drug use: Not Currently   Sexual activity: Not on file  Other Topics Concern   Not on file  Social History Narrative   Not on file   Social Drivers of Health   Tobacco Use: Low Risk (10/09/2024)   Patient History    Smoking Tobacco Use: Never    Smokeless Tobacco Use: Never    Passive Exposure: Not on file  Financial Resource Strain: Low Risk (10/20/2023)   Overall Financial Resource Strain (CARDIA)    Difficulty of Paying Living Expenses: Not hard at all  Food Insecurity: No Food Insecurity (10/20/2023)   Hunger Vital Sign    Worried About Running Out of Food in the Last Year: Never true    Ran Out of Food in the Last Year: Never true  Transportation Needs: No  Transportation Needs (10/20/2023)   PRAPARE - Administrator, Civil Service (Medical): No    Lack of Transportation (Non-Medical): No  Physical Activity: Inactive (10/20/2023)   Exercise Vital Sign    Days of Exercise per Week: 0 days    Minutes of Exercise per Session: 0 min  Stress: No Stress Concern Present (10/20/2023)   [employer] of Occupational Health - Occupational Stress Questionnaire    Feeling of Stress : Only a little  Social Connections: Moderately Integrated (10/20/2023)   Social Connection and Isolation Panel    Frequency of Communication with Friends and Family: More than three times a week    Frequency of Social Gatherings with Friends and Family: Once a week    Attends Religious Services: More than 4 times per year    Active Member of [employer] or Organizations: No    Attends Banker Meetings: Never    Marital  Status: Married  Catering Manager Violence: Not At Risk (10/20/2023)   Humiliation, Afraid, Rape, and Kick questionnaire    Fear of Current or Ex-Partner: No    Emotionally Abused: No    Physically Abused: No    Sexually Abused: No  Depression (PHQ2-9): Low Risk (10/20/2023)   Depression (PHQ2-9)    PHQ-2 Score: 0  Alcohol Screen: Low Risk (10/20/2023)   Alcohol Screen    Last Alcohol Screening Score (AUDIT): 1  Housing: Unknown (10/20/2023)   Housing Stability Vital Sign    Unable to Pay for Housing in the Last Year: No    Number of Times Moved in the Last Year: Not on file    Homeless in the Last Year: No  Utilities: Not At Risk (10/20/2023)   AHC Utilities    Threatened with loss of utilities: No  Health Literacy: Adequate Health Literacy (10/20/2023)   B1300 Health Literacy    Frequency of need for help with medical instructions: Never     Physical Exam   Vitals:   10/16/24 0200 10/16/24 0400  BP: 131/74 105/61  Pulse: 70 71  Resp: 18 16  Temp:  99.1 F (37.3 C)  SpO2: 100% 92%    CONSTITUTIONAL: Well-appearing, NAD NEURO/PSYCH:  Alert and oriented x 3, no focal deficits EYES:  eyes equal and reactive ENT/NECK:  no LAD, no JVD CARDIO: Regular rate, well-perfused, normal S1 and S2 PULM:  CTAB no wheezing or rhonchi GI/GU:  non-distended, non-tender MSK/SPINE:  No gross deformities, no edema SKIN:  no rash, atraumatic   *Additional and/or pertinent findings included in MDM below  Diagnostic and Interventional Summary    EKG Interpretation Date/Time:  Friday October 15 2024 18:32:34 EST Ventricular Rate:  76 PR Interval:  204 QRS Duration:  96 QT Interval:  406 QTC Calculation: 456 R Axis:   67  Text Interpretation: Normal sinus rhythm Normal ECG When compared with ECG of 31-May-2024 09:11, PREVIOUS ECG IS PRESENT Confirmed by Theadore Sharper [phone]) on 10/16/2024 1:10:25 AM       Labs Reviewed  BASIC METABOLIC PANEL WITH GFR - Abnormal; Notable for the following  components:      Result Value   Glucose, Bld 107 (*)    BUN 45 (*)    Creatinine, Ser 2.04 (*)    Calcium 8.2 (*)    GFR, Estimated 39 (*)    All other components within normal limits  PRO BRAIN NATRIURETIC PEPTIDE - Abnormal; Notable for the following components:   Pro Brain Natriuretic Peptide 1,393.0 (*)  All other components within normal limits  CBC  I-STAT CG4 LACTIC ACID, ED  I-STAT CG4 LACTIC ACID, ED    CT Tibia Fibula Left Wo Contrast  Final Result    DG Chest 2 View  Final Result      Medications  sodium chloride  0.9 % bolus 1,000 mL (0 mLs Intravenous Stopped 10/16/24 0307)  cefTRIAXone  (ROCEPHIN ) 2 g in sodium chloride  0.9 % 100 mL IVPB (0 g Intravenous Stopped 10/16/24 0437)  vancomycin  (VANCOCIN ) 2,500 mg in sodium chloride  0.9 % 500 mL IVPB (0 mg Intravenous Stopped 10/16/24 0443)     Procedures  /  Critical Care Procedures  ED Course and Medical Decision Making  Initial Impression and Ddx Spreading erythematous and indurated rash to the left lower extremity, could be cellulitis with failed outpatient therapy.  Leg is quite swollen as well.  There is some consideration for deeper space infection, obtaining CT of the leg to exclude necrotizing infection.  Sitting comfortably with no increased work of breathing, with leg swelling and dyspnea on exertion VTE is considered but felt to be less likely given the appearance of the rash.  Past medical/surgical history that increases complexity of ED encounter: A-fib anticoagulated  Interpretation of Diagnostics I personally reviewed the EKG and my interpretation is as follows: Sinus rhythm  Labs reveal acute kidney injury BNP elevation  Patient Reassessment and Ultimate Disposition/Management     Plan is for admission for cellulitis.  CT does not show deeper space infection.  Patient management required discussion with the following services or consulting groups:  Hospitalist Service  Complexity of Problems  Addressed Acute illness or injury that poses threat of life of bodily function  Additional Data Reviewed and Analyzed Further history obtained from: Prior labs/imaging results  Additional Factors Impacting ED Encounter Risk Consideration of hospitalization  Ozell HERO. Theadore, MD [facility] Emergency Medicine [facility] [email or web address]  Final Clinical Impressions(s) / ED Diagnoses     ICD-10-CM   1. Cellulitis, unspecified cellulitis site  L03.90       ED Discharge Orders     None        Discharge Instructions Discussed with and Provided to Patient:   Discharge Instructions   None      Theadore Ozell HERO, MD 10/16/24 [phone]  "

## 2024-10-16 NOTE — Progress Notes (Signed)
 ED Pharmacy Antibiotic Sign Off An antibiotic consult was received from an ED provider for vancomycin  per pharmacy dosing for cellulitis. A chart review was completed to assess appropriateness.   The following one time order(s) were placed:   -Vancomycin  2500mg  IV x1  Further antibiotic and/or antibiotic pharmacy consults should be ordered by the admitting provider if indicated.   Thank you for allowing pharmacy to be a part of this patient's care.   Rutha Poplar, PharmD, BCPS Clinical Pharmacist 10/16/2024 1:34 AM

## 2024-10-16 NOTE — Assessment & Plan Note (Signed)
 10/16/24 continue tambocor  and lopressor .  10/17/24 stable on lopressor  and flecainide .  10/18/24 stable. On lopressor  and flecanide.

## 2024-10-17 ENCOUNTER — Inpatient Hospital Stay (HOSPITAL_COMMUNITY)

## 2024-10-17 DIAGNOSIS — R7989 Other specified abnormal findings of blood chemistry: Secondary | ICD-10-CM

## 2024-10-17 DIAGNOSIS — N189 Chronic kidney disease, unspecified: Secondary | ICD-10-CM | POA: Diagnosis not present

## 2024-10-17 DIAGNOSIS — N182 Chronic kidney disease, stage 2 (mild): Secondary | ICD-10-CM | POA: Diagnosis not present

## 2024-10-17 DIAGNOSIS — I10 Essential (primary) hypertension: Secondary | ICD-10-CM | POA: Diagnosis not present

## 2024-10-17 DIAGNOSIS — M7989 Other specified soft tissue disorders: Secondary | ICD-10-CM | POA: Diagnosis not present

## 2024-10-17 DIAGNOSIS — R0602 Shortness of breath: Secondary | ICD-10-CM

## 2024-10-17 DIAGNOSIS — N179 Acute kidney failure, unspecified: Secondary | ICD-10-CM | POA: Diagnosis not present

## 2024-10-17 DIAGNOSIS — Z6841 Body Mass Index (BMI) 40.0 and over, adult: Secondary | ICD-10-CM | POA: Diagnosis not present

## 2024-10-17 DIAGNOSIS — I480 Paroxysmal atrial fibrillation: Secondary | ICD-10-CM | POA: Diagnosis not present

## 2024-10-17 DIAGNOSIS — G4733 Obstructive sleep apnea (adult) (pediatric): Secondary | ICD-10-CM | POA: Diagnosis not present

## 2024-10-17 DIAGNOSIS — L03116 Cellulitis of left lower limb: Secondary | ICD-10-CM | POA: Diagnosis not present

## 2024-10-17 LAB — CBC
HCT: 37.7 % — ABNORMAL LOW (ref 39.0–52.0)
Hemoglobin: 12.4 g/dL — ABNORMAL LOW (ref 13.0–17.0)
MCH: 28 pg (ref 26.0–34.0)
MCHC: 32.9 g/dL (ref 30.0–36.0)
MCV: 85.1 fL (ref 80.0–100.0)
Platelets: 238 10*3/uL (ref 150–400)
RBC: 4.43 MIL/uL (ref 4.22–5.81)
RDW: 14.1 % (ref 11.5–15.5)
WBC: 9.2 10*3/uL (ref 4.0–10.5)
nRBC: 0 % (ref 0.0–0.2)

## 2024-10-17 LAB — ECHOCARDIOGRAM COMPLETE
AR max vel: 2.27 cm2
AV Area VTI: 2.42 cm2
AV Area mean vel: 2.3 cm2
AV Mean grad: 6 mmHg
AV Peak grad: 12 mmHg
Ao pk vel: 1.73 m/s
Area-P 1/2: 4.93 cm2
Height: 66 in
S' Lateral: 3.4 cm
Weight: 5185.22 [oz_av]

## 2024-10-17 LAB — MICROALBUMIN / CREATININE URINE RATIO
Creatinine, Urine: 124.9 mg/dL
Microalb Creat Ratio: 2675 mg/g{creat} — ABNORMAL HIGH (ref 0–29)
Microalb, Ur: 3341.2 ug/mL — ABNORMAL HIGH

## 2024-10-17 LAB — BASIC METABOLIC PANEL WITH GFR
Anion gap: 12 (ref 5–15)
BUN: 52 mg/dL — ABNORMAL HIGH (ref 6–20)
CO2: 20 mmol/L — ABNORMAL LOW (ref 22–32)
Calcium: 8 mg/dL — ABNORMAL LOW (ref 8.9–10.3)
Chloride: 105 mmol/L (ref 98–111)
Creatinine, Ser: 2.4 mg/dL — ABNORMAL HIGH (ref 0.61–1.24)
GFR, Estimated: 32 mL/min — ABNORMAL LOW
Glucose, Bld: 100 mg/dL — ABNORMAL HIGH (ref 70–99)
Potassium: 3.8 mmol/L (ref 3.5–5.1)
Sodium: 136 mmol/L (ref 135–145)

## 2024-10-17 LAB — C-REACTIVE PROTEIN: CRP: 6.5 mg/dL — ABNORMAL HIGH

## 2024-10-17 LAB — SEDIMENTATION RATE: Sed Rate: 47 mm/h — ABNORMAL HIGH (ref 0–16)

## 2024-10-17 MED ORDER — FUROSEMIDE 10 MG/ML IJ SOLN
40.0000 mg | Freq: Once | INTRAMUSCULAR | Status: AC
  Administered 2024-10-17: 40 mg via INTRAVENOUS
  Filled 2024-10-17: qty 4

## 2024-10-17 MED ORDER — PERFLUTREN LIPID MICROSPHERE
1.0000 mL | INTRAVENOUS | Status: AC | PRN
  Administered 2024-10-17: 3 mL via INTRAVENOUS

## 2024-10-17 MED ORDER — LINEZOLID 600 MG PO TABS
600.0000 mg | ORAL_TABLET | Freq: Two times a day (BID) | ORAL | Status: AC
  Administered 2024-10-17 – 2024-10-22 (×12): 600 mg via ORAL
  Filled 2024-10-17 (×12): qty 1

## 2024-10-17 MED ORDER — ACETAMINOPHEN 500 MG PO TABS: 1000.0000 mg | ORAL_TABLET | Freq: Four times a day (QID) | ORAL | Status: AC | PRN

## 2024-10-17 MED ORDER — ACETAMINOPHEN 650 MG RE SUPP: 650.0000 mg | Freq: Four times a day (QID) | RECTAL | Status: AC | PRN

## 2024-10-17 MED ORDER — LACTATED RINGERS IV SOLN: INTRAVENOUS | Status: DC

## 2024-10-17 NOTE — Progress Notes (Signed)
 " PROGRESS NOTE    Bruce Marshall  FMW:[id number] DOB: [birth year] DOA: 10/15/2024 PCP: Tanda Bleacher, MD  Subjective: Pt seen and examined. Pt states his urine has changed back to normal yellow in color. Sitting up at edge of bed with legs in dependent position. No pain. Still awaiting LE U/S to r/o DVT. Had some difficulty lying flat last night to sleep. He slept in recliner.   Hospital Course: CC: left leg swelling and redness HPI: Bruce Marshall is a 49 y.o. male with medical history significant of paroxysmal atrial fibrillation-not on anticoagulation due to low CHA2DS2-VASc score is 0, obstructive sleep apnea on CPAP at home, CKD stage II presented to emergency department complaining of leg swelling and worsening redness of the left lower extremity.  Patient reported he was diagnosed with cellulitis and started on antibiotic have been taking for last 2 weeks without improvement of the swelling and redness.  Redness and rash is spreading beyond the area.   Patient reported fever last Wednesday however denies any chills.  patient also reporting feeling short of breath with ambulation.  Denies orthopnea, PND, chest pain and palpitation.  Per patient he has chronic bilateral lower extremities dependent edema which is chronic in nature without any new changes.   Patient reported he use CPAP machine at home however he not able using it for last 1 month as he left it at [place]  and going back to [place]  in February.   ED Course:  At presentation to ED patient is hemodynamically stable.  Afebrile.   Lab work, CBC unremarkable.  BMP showing elevated BUN 45, elevated creatinine 2.04. Elevated proBNP 1393.   EKG showing normal sinus rhythm heart rate 76.   Chest x-ray no active disease process.   CT of the left lower extremity evidence for cellulitis. IMPRESSION: 1. Diffuse subcutaneous edema and swelling in the lower leg ;correlate clinically for cellulitis. No evidence for abscess , deep  fascial plane edema or soft tissue air.   In the ED patient received 1 L of NS bolus, vancomycin  and ceftriaxone .   Hospitalist consulted for further evaluation management for left lower extremity cellulitis and AKI.  Significant Events: Admitted 10/15/2024 left leg cellulitis and AKI   Admission Labs: WBC 8.7, HgB 13.7, plt 258 proBNP 1393 Na 137, K 3.8, CO2 of 22, BUN 45, Scr 2.04, glu 107 UA amber/cloudy, Lg HgB, negative nitrite, moderate LE, WBC >50, many bacteria  Admission Imaging Studies: CXR No acute process.  CT left tib/fib Diffuse subcutaneous edema and swelling in the lower leg ;correlate clinically for cellulitis. No evidence for abscess , deep fascial plane edema or soft tissue air.  Significant Labs:   Significant Imaging Studies:   Antibiotic Therapy: Anti-infectives (From admission, onward)    Start     Dose/Rate Route Frequency Ordered Stop   10/17/24 0000  cefTRIAXone  (ROCEPHIN ) 2 g in sodium chloride  0.9 % 100 mL IVPB        2 g 200 mL/hr over 30 Minutes Intravenous Every 24 hours 10/16/24 0509     10/16/24 2200  linezolid  (ZYVOX ) IVPB 600 mg        600 mg 300 mL/hr over 60 Minutes Intravenous Every 12 hours 10/16/24 0542     10/16/24 0145  cefTRIAXone  (ROCEPHIN ) 2 g in sodium chloride  0.9 % 100 mL IVPB        2 g 200 mL/hr over 30 Minutes Intravenous  Once 10/16/24 0130 10/16/24 0437   10/16/24 0145  vancomycin  (VANCOCIN ) 2,500 mg  in sodium chloride  0.9 % 500 mL IVPB        2,500 mg 262.5 mL/hr over 120 Minutes Intravenous  Once 10/16/24 0132 10/16/24 0443       Procedures:   Consultants:     Assessment and Plan: * Cellulitis of left lower extremity 10/16/24 failed outpatient augmentin. Pt states he gets scratched by his dog's nails. On IV zyvox  and IV rocephin . Will check Left LE U/S to r/o DVT.  10/17/24 left leg cellulitis not any worse than yesterday. See pictures. Continue with IV rocephin /PO zyvox . Awaiting left LE U/S to  DVT   Date Right leg Left leg  10-17-2023    10-17-2024        Acute kidney injury superimposed on chronic kidney disease 10/16/24 admitted with Scr 2.04. just admitted earlier this AM.  BMP tomorrow.  10/17/24 Scr up to 2.4 today with BUN 52. Pt c/o of SOB last night. Will give 1 dose of IV lasix . Pt states his urine is no longer brown but clear yellow now. Repeat BMP in AM. No RBC casts on UA. Although he had large HgB on UA. ??strep glomerularnephritis?? Check ASO and C3 levels.   Elevated brain natriuretic peptide (BNP) level 10/16/24 echo pending.  10/17/24 awaiting echo. Give 1 dose IV lasix  today.    CKD (chronic kidney disease), stage II - Baseline scr around 1.3 10/16/24 Baseline scr around 1.3  10/17/24 AKI on CKD stage 2. Scr up to 2.4 today. Will give IV lasix . Repeat BMP in AM.   Essential hypertension 10/16/24 continue with lopressor  25 mg bid  10/17/24 stable on lopressor  25 mg bid.    Morbid obesity with BMI of 50.0-59.9, adult (HCC) Body mass index is 52.31 kg/m.   Paroxysmal atrial fibrillation (HCC) 10/16/24 continue tambocor  and lopressor .  10/17/24 stable on lopressor  and flecainide .     DVT prophylaxis: SCDs Start: 10/16/24 0510 Place TED hose Start: 10/16/24 0510    Code Status: Full Code Family Communication: no family at bedside. Pt is decisional Disposition Plan: return home Reason for continuing need for hospitalization: remains on IV ABX.  Objective: Vitals:   10/16/24 1520 10/16/24 2132 10/17/24 0048 10/17/24 0455  BP: 118/71 130/73 122/76 119/70  Pulse: 64 72 63 65  Resp: 18 16  20   Temp: 97.8 F (36.6 C) 98.7 F (37.1 C) 97.9 F (36.6 C) 98.7 F (37.1 C)  TempSrc: Oral Oral Oral   SpO2: 97% 99% 95% 95%  Weight:      Height:        Intake/Output Summary (Last 24 hours) at 10/17/2024 0951 Last data filed at 10/16/2024 1317 Gross per 24 hour  Intake 240 ml  Output --  Net 240 ml   Filed Weights   10/15/24 1819   Weight: (!) 147 kg    Examination:  Physical Exam Vitals and nursing note reviewed.  Constitutional:      Appearance: He is obese.  HENT:     Head: Normocephalic and atraumatic.     Nose: Nose normal.  Eyes:     General: No scleral icterus. Cardiovascular:     Rate and Rhythm: Normal rate and regular rhythm.  Pulmonary:     Effort: Pulmonary effort is normal.     Breath sounds: Normal breath sounds.  Abdominal:     General: Bowel sounds are normal. There is no distension.     Palpations: Abdomen is soft.  Skin:    General: Skin is warm and dry.  Capillary Refill: Capillary refill takes less than 2 seconds.     Findings: Erythema present.     Comments: See pictures of left leg  Neurological:     Mental Status: He is alert and oriented to person, place, and time.     Data Reviewed: I have personally reviewed following labs and imaging studies  CBC: Recent Labs  Lab 10/15/24 1830 10/17/24 0319  WBC 8.7 9.2  HGB 13.7 12.4*  HCT 41.5 37.7*  MCV 86.3 85.1  PLT 258 238   Basic Metabolic Panel: Recent Labs  Lab 10/15/24 1830 10/17/24 0319  NA 137 136  K 3.8 3.8  CL 103 105  CO2 22 20*  GLUCOSE 107* 100*  BUN 45* 52*  CREATININE 2.04* 2.40*  CALCIUM 8.2* 8.0*   GFR: Estimated Creatinine Clearance: 50 mL/min (A) (by C-G formula based on SCr of 2.4 mg/dL (H)). ProBNP, BNP (last 5 results) Recent Labs    10/15/24 1830  PROBNP 1,393.0*   Sepsis Labs: Recent Labs  Lab 10/15/24 1836  LATICACIDVEN 0.8    Recent Results (from the past 240 hours)  Culture, blood (Routine X 2) w Reflex to ID Panel     Status: None (Preliminary result)   Collection Time: 10/16/24  5:39 AM   Specimen: BLOOD  Result Value Ref Range Status   Specimen Description BLOOD SITE NOT SPECIFIED  Final   Special Requests   Final    BOTTLES DRAWN AEROBIC AND ANAEROBIC Blood Culture results may not be optimal due to an inadequate volume of blood received in culture bottles    Culture   Final    NO GROWTH 1 DAY Performed at [facility], [address] [place], [place] [address]    Report Status PENDING  Incomplete  Culture, blood (Routine X 2) w Reflex to ID Panel     Status: None (Preliminary result)   Collection Time: 10/16/24  5:39 AM   Specimen: BLOOD  Result Value Ref Range Status   Specimen Description BLOOD SITE NOT SPECIFIED  Final   Special Requests   Final    BOTTLES DRAWN AEROBIC AND ANAEROBIC Blood Culture results may not be optimal due to an inadequate volume of blood received in culture bottles   Culture   Final    NO GROWTH 1 DAY Performed at [facility], [address] [place], [place] [address]    Report Status PENDING  Incomplete     Radiology Studies: CT Tibia Fibula Left Wo Contrast Result Date: 10/16/2024 EXAM: CT LEFT LOWER EXTREMITY, WITHOUT IV CONTRAST 10/16/2024 01:48:05 AM TECHNIQUE: Axial images were acquired through the left lower extremity without IV contrast. Reformatted images were reviewed. Automated exposure control, iterative reconstruction, and/or weight based adjustment of the mA/kV was utilized to reduce the radiation dose to as low as reasonably achievable. COMPARISON: None available. CLINICAL HISTORY: Soft tissue infection suspected, lower leg, xray done. Suspected soft tissue infection of the lower leg; X-ray done. FINDINGS: BONES AND JOINTS: No acute fracture or focal osseous lesion. No dislocation. The joint spaces are normal. SOFT TISSUES: There is diffuse subcutaneous edema and swelling. There is no evidence for soft tissue gas. Deep fascial planes are well preserved. There is no fluid collection or foreign body identified. IMPRESSION: 1. Diffuse subcutaneous edema and swelling in the lower leg ;correlate clinically for cellulitis. No evidence for abscess , deep fascial plane edema or soft tissue air. Electronically signed by: Greig Pique MD 10/16/2024 03:47 AM EST RP  Workstation: [id number]   DG Chest 2  View Result Date: 10/15/2024 EXAM: 2 VIEW(S) XRAY OF THE CHEST 10/15/2024 06:57:00 PM COMPARISON: 07/14/2023. CLINICAL HISTORY: shob FINDINGS: LUNGS AND PLEURA: No focal pulmonary opacity. No pleural effusion. No pneumothorax. HEART AND MEDIASTINUM: No acute abnormality of the cardiac and mediastinal silhouettes. BONES AND SOFT TISSUES: No acute osseous abnormality. IMPRESSION: 1. No acute process. Electronically signed by: Greig Pique MD 10/15/2024 08:17 PM EST RP Workstation: [id number]    Scheduled Meds:  enoxaparin  (LOVENOX ) injection  60 mg Subcutaneous Q24H   flecainide   150 mg Oral BID   furosemide   40 mg Intravenous Once   linezolid   600 mg Oral Q12H   metoprolol  tartrate  25 mg Oral BID   sodium chloride  flush  3 mL Intravenous Q12H   sodium chloride  flush  3 mL Intravenous Q12H   Continuous Infusions:  cefTRIAXone  (ROCEPHIN )  IV 2 g (10/17/24 0049)     LOS: 1 day   Time spent: 60 minutes  Camellia Door, DO  [facility]  10/17/2024, 9:51 AM  "

## 2024-10-17 NOTE — TOC Initial Note (Signed)
 Transition of Care [facility]) - Initial/Assessment Note    Patient Details  Name: Bruce Marshall MRN: [id number] Date of Birth: 25-Oct-1975  [facility] of [facility] CM/SW Contact:    Arlana JINNY Nicholaus ISRAEL Phone Number: [phone] 10/17/2024, 11:00 AM  Clinical Narrative:CSW attempted to reach patient by phone and left a VM. Will continue to make attempts to reach the patient to complete initial/assessment.    Unit CSW/CM will continue to follow and monitor dc readiness.                          Patient Goals and CMS Choice            Expected Discharge Plan and Services                                              Prior Living Arrangements/Services                       Activities of Daily Living      Permission Sought/Granted                  Emotional Assessment              Admission diagnosis:  Cellulitis of left lower extremity [L03.116] Cellulitis, unspecified cellulitis site [L03.90] Patient Active Problem List   Diagnosis Date Noted   Cellulitis of left lower extremity 10/16/2024   Acute kidney injury superimposed on chronic kidney disease 10/16/2024   Essential hypertension 10/16/2024   CKD (chronic kidney disease), stage II - Baseline scr around 1.3 10/16/2024   Obstructive sleep apnea 10/16/2024   Elevated brain natriuretic peptide (BNP) level 10/16/2024   Paroxysmal atrial fibrillation (HCC) 12/09/2022   Morbid obesity with BMI of 50.0-59.9, adult (HCC) 12/09/2022   PCP:  Tanda Bleacher, MD Pharmacy:   [id number] [address] [place], [place] - [address] AT [facility] [address] [facility] [address] [place] [address] Phone: [phone] Fax: [phone]  [facility] [address] [place] [address] Phone: [phone] Fax: [phone]     Social Drivers of Health (SDOH) Social History: SDOH Screenings   Food Insecurity: No Food Insecurity (10/20/2023)   Housing: Unknown (10/20/2023)  Transportation Needs: No Transportation Needs (10/20/2023)  Utilities: Not At Risk (10/20/2023)  Alcohol Screen: Low Risk (10/20/2023)  Depression (PHQ2-9): Low Risk (10/20/2023)  Financial Resource Strain: Low Risk (10/20/2023)  Physical Activity: Inactive (10/20/2023)  Social Connections: Moderately Integrated (10/20/2023)  Stress: No Stress Concern Present (10/20/2023)  Tobacco Use: Low Risk (10/16/2024)  Health Literacy: Adequate Health Literacy (10/20/2023)   SDOH Interventions:     Readmission Risk Interventions     No data to display

## 2024-10-17 NOTE — Progress Notes (Signed)
 VASCULAR LAB    Left lower extremity venous duplex has been performed.  See CV proc for preliminary results.   Taura Lamarre, RVT 10/17/2024, 12:33 PM

## 2024-10-17 NOTE — Progress Notes (Signed)
Placed patient on CPAP for the night via auto-mode.  

## 2024-10-18 ENCOUNTER — Inpatient Hospital Stay (HOSPITAL_COMMUNITY)

## 2024-10-18 ENCOUNTER — Telehealth: Payer: Self-pay | Admitting: Cardiology

## 2024-10-18 DIAGNOSIS — L03116 Cellulitis of left lower limb: Secondary | ICD-10-CM | POA: Diagnosis not present

## 2024-10-18 DIAGNOSIS — R7989 Other specified abnormal findings of blood chemistry: Secondary | ICD-10-CM | POA: Diagnosis not present

## 2024-10-18 DIAGNOSIS — I10 Essential (primary) hypertension: Secondary | ICD-10-CM

## 2024-10-18 DIAGNOSIS — I480 Paroxysmal atrial fibrillation: Secondary | ICD-10-CM

## 2024-10-18 DIAGNOSIS — N179 Acute kidney failure, unspecified: Secondary | ICD-10-CM | POA: Diagnosis not present

## 2024-10-18 DIAGNOSIS — N182 Chronic kidney disease, stage 2 (mild): Secondary | ICD-10-CM | POA: Diagnosis not present

## 2024-10-18 DIAGNOSIS — Z6841 Body Mass Index (BMI) 40.0 and over, adult: Secondary | ICD-10-CM

## 2024-10-18 DIAGNOSIS — I483 Typical atrial flutter: Secondary | ICD-10-CM

## 2024-10-18 LAB — MAGNESIUM: Magnesium: 2 mg/dL (ref 1.7–2.4)

## 2024-10-18 LAB — SYPHILIS: RPR W/REFLEX TO RPR TITER AND TREPONEMAL ANTIBODIES, TRADITIONAL SCREENING AND DIAGNOSIS ALGORITHM: RPR Ser Ql: NONREACTIVE

## 2024-10-18 LAB — COMPREHENSIVE METABOLIC PANEL WITH GFR
ALT: 19 U/L (ref 0–44)
AST: 21 U/L (ref 15–41)
Albumin: 2.9 g/dL — ABNORMAL LOW (ref 3.5–5.0)
Alkaline Phosphatase: 60 U/L (ref 38–126)
Anion gap: 13 (ref 5–15)
BUN: 60 mg/dL — ABNORMAL HIGH (ref 6–20)
CO2: 21 mmol/L — ABNORMAL LOW (ref 22–32)
Calcium: 7.9 mg/dL — ABNORMAL LOW (ref 8.9–10.3)
Chloride: 104 mmol/L (ref 98–111)
Creatinine, Ser: 3.13 mg/dL — ABNORMAL HIGH (ref 0.61–1.24)
GFR, Estimated: 23 mL/min — ABNORMAL LOW
Glucose, Bld: 88 mg/dL (ref 70–99)
Potassium: 4.3 mmol/L (ref 3.5–5.1)
Sodium: 138 mmol/L (ref 135–145)
Total Bilirubin: 0.2 mg/dL (ref 0.0–1.2)
Total Protein: 6.5 g/dL (ref 6.5–8.1)

## 2024-10-18 LAB — HEPATITIS PANEL, ACUTE
HCV Ab: NONREACTIVE
Hep A IgM: NONREACTIVE
Hep B C IgM: NONREACTIVE
Hepatitis B Surface Ag: NONREACTIVE

## 2024-10-18 LAB — C3 COMPLEMENT: C3 Complement: 60 mg/dL — ABNORMAL LOW (ref 82–167)

## 2024-10-18 LAB — ANTISTREPTOLYSIN O TITER: ASO: 33 [IU]/mL (ref 0.0–200.0)

## 2024-10-18 NOTE — Consult Note (Signed)
 Nephrology Consult   Requesting provider: Camellia Door Service requesting consult: Hospitalist Reason for consult: AKI   Assessment/Recommendations: Bruce Marshall is a/an 49 y.o. male with a past medical history severe obesity, osa, afib who present w/ AKI and cellulitis  AKI: Baseline creatinine around 1-1.3.  Rising creatinine at this time.  Significant hematuria and proteinuria.  Also reported history of hematuria in the remote past with negative urological evaluation.  ATN is possible but I am definitely concerned about active glomerular disease particularly IgA. -Obtain GN workup, C3 slightly low -Consider kidney biopsy based on progress -Would hold further diuresis for now -Continue to monitor daily Cr, Dose meds for GFR -Monitor Daily I/Os, Daily weight  -Maintain MAP>65 for optimal renal perfusion.  -Avoid nephrotoxic medications including NSAIDs -Use synthetic opioids (Fentanyl /Dilaudid ) if needed  Chest heaviness/shortness of breath: Symptoms concerning for possible angina based on history.  Consider cardiology evaluation.  Would hold diuresis at this time.  Does have some dilation in his IVC so diuresis could be considered.  This could represent pulmonary hypertension given his obesity and OSA although RV function normal on echo.  OSA: CPAP per primary team  Cellulitis: Antibiotics per primary team   Recommendations conveyed to primary service.    [facility] 10/18/2024 11:19 AM   _____________________________________________________________________________________ CC: Shortness of breath  History of Present Illness: Bruce Marshall is a/an 49 y.o. male with a past medical history of atrial fibrillation, OSA, severe obesity who presents with worsening chest heaviness and erythema of the lower extremity  Patient states he has been dealing with left lower leg erythema for about 2 weeks.  He says one of his dogs scratched it in the area  got red.  He went to urgent care and was given Augmentin.  He then went to the emergency department on 1/24 and was given doxycycline .  After he left the erythema continued to spread so he came back in the hospital for evaluation.  He was also experiencing some chest heaviness along with shortness of breath.  He had a similar sensation several weeks ago when he was having sex with his wife.  The chest heaviness improved with rest.  He has some chronic lower extremity edema but feels like the edema he is experiencing at this time is worse.  Patient has had intermittent fevers but denies any chills.  He has some orthopnea but mostly the chest heaviness.  Denies nausea, vomiting, diarrhea.  He did notice that his urine was very dark.  He has had this happen before.  He remembers having hematuria about 15 years ago and underwent an evaluation with urology with normal cystoscopy.  He has been taking NSAIDs intermittently.  In the emergency department he was found to be hemodynamically stable.  Creatinine was 2 from 1 a week earlier.  Creatinine has continued to rise now 3.1.  Urinalysis with significant blood and pyuria.  UACR markedly elevated at 2675.  Echocardiogram with normal ejection fraction.   Medications:  Current Facility-Administered Medications  Medication Dose Route Frequency Provider Last Rate Last Admin   acetaminophen  (TYLENOL ) tablet 1,000 mg  1,000 mg Oral Q6H PRN Door Camellia, DO       Or   acetaminophen  (TYLENOL ) suppository 650 mg  650 mg Rectal Q6H PRN Door Camellia, DO       cefTRIAXone  (ROCEPHIN ) 2 g in sodium chloride  0.9 % 100 mL IVPB  2 g Intravenous Q24H Sundil, Subrina, MD   Stopped at 10/18/24 0131  enoxaparin  (LOVENOX ) injection 60 mg  60 mg Subcutaneous Q24H Sundil, Subrina, MD   60 mg at 10/17/24 1641   flecainide  (TAMBOCOR ) tablet 150 mg  150 mg Oral BID Sundil, Subrina, MD   150 mg at 10/18/24 0941   linezolid  (ZYVOX ) tablet 600 mg  600 mg Oral Q12H Jimenez, Aileen, RPH    600 mg at 10/18/24 9058   metoprolol  tartrate (LOPRESSOR ) tablet 25 mg  25 mg Oral BID Sundil, Subrina, MD   25 mg at 10/18/24 0941   ondansetron  (ZOFRAN ) tablet 4 mg  4 mg Oral Q6H PRN Sundil, Subrina, MD       Or   ondansetron  (ZOFRAN ) injection 4 mg  4 mg Intravenous Q6H PRN Sundil, Subrina, MD       sodium chloride  flush (NS) 0.9 % injection 3 mL  3 mL Intravenous Q12H Sundil, Subrina, MD   3 mL at 10/18/24 0946   sodium chloride  flush (NS) 0.9 % injection 3 mL  3 mL Intravenous Q12H Sundil, Subrina, MD   3 mL at 10/18/24 0946   sodium chloride  flush (NS) 0.9 % injection 3 mL  3 mL Intravenous PRN Sundil, Subrina, MD         ALLERGIES Patient has no known allergies.  MEDICAL HISTORY Past Medical History:  Diagnosis Date   A-fib (HCC)    Chest pain    Edema    lower extremitiy   Groin pain    Hypertension    borderline   Sleep apnea    no CPAP     SOCIAL HISTORY Social History   Socioeconomic History   Marital status: Married    Spouse name: Not on file   Number of children: Not on file   Years of education: Not on file   Highest education level: Not on file  Occupational History   Not on file  Tobacco Use   Smoking status: Never   Smokeless tobacco: Never   Tobacco comments:    Never smoke 11/06/22  Vaping Use   Vaping status: Never Used  Substance and Sexual Activity   Alcohol use: Not Currently   Drug use: Not Currently   Sexual activity: Not on file  Other Topics Concern   Not on file  Social History Narrative   Not on file   Social Drivers of Health   Tobacco Use: Low Risk (10/16/2024)   Patient History    Smoking Tobacco Use: Never    Smokeless Tobacco Use: Never    Passive Exposure: Not on file  Financial Resource Strain: Low Risk (10/20/2023)   Overall Financial Resource Strain (CARDIA)    Difficulty of Paying Living Expenses: Not hard at all  Food Insecurity: No Food Insecurity (10/17/2024)   Epic    Worried About Radiation Protection Practitioner of Food in the  Last Year: Never true    Ran Out of Food in the Last Year: Never true  Transportation Needs: No Transportation Needs (10/17/2024)   Epic    Lack of Transportation (Medical): No    Lack of Transportation (Non-Medical): No  Physical Activity: Inactive (10/20/2023)   Exercise Vital Sign    Days of Exercise per Week: 0 days    Minutes of Exercise per Session: 0 min  Stress: No Stress Concern Present (10/20/2023)   [employer] of Occupational Health - Occupational Stress Questionnaire    Feeling of Stress : Only a little  Social Connections: Moderately Integrated (10/20/2023)   Social Connection and Isolation Panel    Frequency  of Communication with Friends and Family: More than three times a week    Frequency of Social Gatherings with Friends and Family: Once a week    Attends Religious Services: More than 4 times per year    Active Member of Clubs or Organizations: No    Attends Banker Meetings: Never    Marital Status: Married  Catering Manager Violence: Not At Risk (10/17/2024)   Epic    Fear of Current or Ex-Partner: No    Emotionally Abused: No    Physically Abused: No    Sexually Abused: No  Depression (PHQ2-9): Low Risk (10/20/2023)   Depression (PHQ2-9)    PHQ-2 Score: 0  Alcohol Screen: Low Risk (10/20/2023)   Alcohol Screen    Last Alcohol Screening Score (AUDIT): 1  Housing: Low Risk (10/17/2024)   Epic    Unable to Pay for Housing in the Last Year: No    Number of Times Moved in the Last Year: 0    Homeless in the Last Year: No  Utilities: Not At Risk (10/17/2024)   Epic    Threatened with loss of utilities: No  Health Literacy: Adequate Health Literacy (10/20/2023)   B1300 Health Literacy    Frequency of need for help with medical instructions: Never     FAMILY HISTORY Family History  Problem Relation Age of Onset   Healthy Daughter    Heart attack Maternal Uncle    Cancer Neg Hx    Stroke Neg Hx    Colon cancer Neg Hx    Esophageal cancer Neg Hx        Review of Systems: 12 systems reviewed Otherwise as per HPI, all other systems reviewed and negative  Physical Exam: Vitals:   10/18/24 0437 10/18/24 0808  BP: 104/82 113/72  Pulse: 69 61  Resp:    Temp:  97.8 F (36.6 C)  SpO2: 100% 97%   No intake/output data recorded.  Intake/Output Summary (Last 24 hours) at 10/18/2024 1119 Last data filed at 10/18/2024 0359 Gross per 24 hour  Intake 206.28 ml  Output --  Net 206.28 ml   General: well-appearing, no acute distress HEENT: anicteric sclera, oropharynx clear without lesions CV: Normal rate, no rub Lungs: clear to auscultation bilaterally, normal work of breathing Abd: soft, non-tender, non-distended Skin: Mild venous stasis changes on the right shin, chronic skin changes on the left shin along with surrounding erythema, otherwise no visible lesions or rashes Psych: alert, engaged, appropriate mood and affect Musculoskeletal: no obvious deformities Neuro: normal speech, no gross focal deficits   Test Results Reviewed Lab Results  Component Value Date   NA 138 10/18/2024   K 4.3 10/18/2024   CL 104 10/18/2024   CO2 21 (L) 10/18/2024   BUN 60 (H) 10/18/2024   CREATININE 3.13 (H) 10/18/2024   CALCIUM 7.9 (L) 10/18/2024   ALBUMIN 2.9 (L) 10/18/2024    CBC Recent Labs  Lab 10/15/24 1830 10/17/24 0319  WBC 8.7 9.2  HGB 13.7 12.4*  HCT 41.5 37.7*  MCV 86.3 85.1  PLT 258 238    I have reviewed all relevant outside healthcare records related to the patient's current hospitalization

## 2024-10-18 NOTE — Plan of Care (Signed)
  Problem: Education: Goal: Knowledge of General Education information will improve Description: Including pain rating scale, medication(s)/side effects and non-pharmacologic comfort measures Outcome: Progressing   Problem: Health Behavior/Discharge Planning: Goal: Ability to manage health-related needs will improve Outcome: Progressing   Problem: Clinical Measurements: Goal: Ability to maintain clinical measurements within normal limits will improve Outcome: Progressing Goal: Will remain free from infection Outcome: Progressing Goal: Diagnostic test results will improve Outcome: Progressing   Problem: Activity: Goal: Risk for activity intolerance will decrease Outcome: Progressing   Problem: Nutrition: Goal: Adequate nutrition will be maintained Outcome: Progressing   Problem: Pain Managment: Goal: General experience of comfort will improve and/or be controlled Outcome: Progressing   Problem: Safety: Goal: Ability to remain free from injury will improve Outcome: Progressing   Problem: Skin Integrity: Goal: Risk for impaired skin integrity will decrease Outcome: Progressing

## 2024-10-18 NOTE — Assessment & Plan Note (Addendum)
 10/18/24 continue CPAP at night.  10/19/24 continue CPAP at night.

## 2024-10-18 NOTE — Telephone Encounter (Signed)
 Shelly can we do 1 week monitor to eval afib burden? Santo can read thanks.

## 2024-10-19 ENCOUNTER — Inpatient Hospital Stay

## 2024-10-19 DIAGNOSIS — I480 Paroxysmal atrial fibrillation: Secondary | ICD-10-CM

## 2024-10-19 DIAGNOSIS — N182 Chronic kidney disease, stage 2 (mild): Secondary | ICD-10-CM | POA: Diagnosis not present

## 2024-10-19 DIAGNOSIS — L03116 Cellulitis of left lower limb: Secondary | ICD-10-CM | POA: Diagnosis not present

## 2024-10-19 DIAGNOSIS — R7989 Other specified abnormal findings of blood chemistry: Secondary | ICD-10-CM | POA: Diagnosis not present

## 2024-10-19 DIAGNOSIS — N179 Acute kidney failure, unspecified: Secondary | ICD-10-CM | POA: Diagnosis not present

## 2024-10-19 LAB — ANCA PROFILE
Anti-MPO Antibodies: <0.2 U (ref 0.0–0.9)
Anti-PR3 Antibodies: <0.2 U (ref 0.0–0.9)
Atypical P-ANCA titer: <1:20 {titer}
C-ANCA: <1:20 {titer}
P-ANCA: <1:20 {titer}

## 2024-10-19 LAB — PROTEIN ELECTROPHORESIS, SERUM
A/G Ratio: 0.6 — ABNORMAL LOW (ref 0.7–1.7)
Albumin ELP: 2.7 g/dL — ABNORMAL LOW (ref 2.9–4.4)
Alpha-1-Globulin: 0.4 g/dL (ref 0.0–0.4)
Alpha-2-Globulin: 1.1 g/dL — ABNORMAL HIGH (ref 0.4–1.0)
Beta Globulin: 1.2 g/dL (ref 0.7–1.3)
Gamma Globulin: 1.7 g/dL (ref 0.4–1.8)
Globulin, Total: 4.3 g/dL — ABNORMAL HIGH (ref 2.2–3.9)
Total Protein ELP: 7 g/dL (ref 6.0–8.5)

## 2024-10-19 LAB — RENAL FUNCTION PANEL
Albumin: 2.9 g/dL — ABNORMAL LOW (ref 3.5–5.0)
Anion gap: 15 (ref 5–15)
BUN: 69 mg/dL — ABNORMAL HIGH (ref 6–20)
CO2: 19 mmol/L — ABNORMAL LOW (ref 22–32)
Calcium: 8 mg/dL — ABNORMAL LOW (ref 8.9–10.3)
Chloride: 101 mmol/L (ref 98–111)
Creatinine, Ser: 4.36 mg/dL — ABNORMAL HIGH (ref 0.61–1.24)
GFR, Estimated: 16 mL/min — ABNORMAL LOW
Glucose, Bld: 100 mg/dL — ABNORMAL HIGH (ref 70–99)
Phosphorus: 5.3 mg/dL — ABNORMAL HIGH (ref 2.5–4.6)
Potassium: 4.5 mmol/L (ref 3.5–5.1)
Sodium: 134 mmol/L — ABNORMAL LOW (ref 135–145)

## 2024-10-19 LAB — KAPPA/LAMBDA LIGHT CHAINS
Kappa free light chain: 100.8 mg/L — ABNORMAL HIGH (ref 3.3–19.4)
Kappa, lambda light chain ratio: 1.01 (ref 0.26–1.65)
Lambda free light chains: 99.6 mg/L — ABNORMAL HIGH (ref 5.7–26.3)

## 2024-10-19 LAB — GLOMERULAR BASEMENT MEMBRANE ANTIBODIES: GBM Ab: <0.2 U (ref 0.0–0.9)

## 2024-10-19 LAB — C3 COMPLEMENT: C3 Complement: 63 mg/dL — ABNORMAL LOW (ref 82–167)

## 2024-10-19 LAB — ANA W/REFLEX IF POSITIVE: Anti Nuclear Antibody (ANA): NEGATIVE

## 2024-10-19 LAB — C4 COMPLEMENT: Complement C4, Body Fluid: 46 mg/dL — ABNORMAL HIGH (ref 12–38)

## 2024-10-19 MED ORDER — ENOXAPARIN SODIUM 40 MG/0.4ML IJ SOSY
40.0000 mg | PREFILLED_SYRINGE | INTRAMUSCULAR | Status: AC
  Filled 2024-10-19: qty 0.4

## 2024-10-19 MED ORDER — ENOXAPARIN SODIUM 40 MG/0.4ML IJ SOSY: 40.0000 mg | PREFILLED_SYRINGE | INTRAMUSCULAR | Status: DC

## 2024-10-19 NOTE — Progress Notes (Unsigned)
Enrolled patient for a 7 day Zio XT monitor to be mailed to patients home   Bruce Marshall to read

## 2024-10-19 NOTE — Progress Notes (Signed)
 Nephrology Follow-Up Consult note   Assessment/Recommendations: Bruce Marshall is a/an 49 y.o. male with a past medical history significant for severe obesity, osa, afib who present w/ AKI and cellulitis   AKI: Baseline creatinine around 1-1.3.  Rising creatinine at this time.  Significant hematuria and proteinuria.  Also reported history of hematuria in the remote past with negative urological evaluation.   ATN is possible but I am definitely concerned about active glomerular disease particularly IgA.  So far serologies are unrevealing except for mildly decreased C3 - Follow-up serologies -Consult IR for kidney biopsy -Tentatively plan for temporary dialysis catheter tomorrow.  May need dialysis if he continues to worsen quickly -Would hold further diuresis for now -Continue to monitor daily Cr, Dose meds for GFR -Monitor Daily I/Os, Daily weight  -Maintain MAP>65 for optimal renal perfusion.  -Avoid nephrotoxic medications including NSAIDs -Use synthetic opioids (Fentanyl /Dilaudid ) if needed   Chest heaviness/shortness of breath: Symptoms concerning for possible angina based on history.  Agree with cardiology that signs of volume overload are mixed and likely not severe.  However, given his symptoms of ongoing shortness of breath he may deserve further evaluation.  Possibly with right heart catheterization   OSA: CPAP per primary team   Cellulitis: Antibiotics per primary team   Recommendations conveyed to primary service.    [facility] 10/19/2024 10:36 AM  ___________________________________________________________  CC: Shortness of breath  Interval History/Subjective: Patient states he continues to have difficulties breathing.  Particularly when he lays back.  No more significant chest pain.  Creatinine rising precipitously   Medications:  Current Facility-Administered Medications  Medication Dose Route Frequency Provider Last Rate Last  Admin   acetaminophen  (TYLENOL ) tablet 1,000 mg  1,000 mg Oral Q6H PRN Laurence Locus, DO       Or   acetaminophen  (TYLENOL ) suppository 650 mg  650 mg Rectal Q6H PRN Laurence Locus, DO       cefTRIAXone  (ROCEPHIN ) 2 g in sodium chloride  0.9 % 100 mL IVPB  2 g Intravenous Q24H Sundil, Subrina, MD 200 mL/hr at 10/19/24 0016 2 g at 10/19/24 0016   enoxaparin  (LOVENOX ) injection 40 mg  40 mg Subcutaneous Q24H Laurence Locus, DO       flecainide  (TAMBOCOR ) tablet 150 mg  150 mg Oral BID Sundil, Subrina, MD   150 mg at 10/19/24 0932   linezolid  (ZYVOX ) tablet 600 mg  600 mg Oral Q12H Jimenez, Aileen, RPH   600 mg at 10/19/24 9067   metoprolol  tartrate (LOPRESSOR ) tablet 25 mg  25 mg Oral BID Sundil, Subrina, MD   25 mg at 10/19/24 0931   ondansetron  (ZOFRAN ) tablet 4 mg  4 mg Oral Q6H PRN Sundil, Subrina, MD       Or   ondansetron  (ZOFRAN ) injection 4 mg  4 mg Intravenous Q6H PRN Lee, Subrina, MD       sodium chloride  flush (NS) 0.9 % injection 3 mL  3 mL Intravenous Q12H Sundil, Subrina, MD   3 mL at 10/18/24 2117   sodium chloride  flush (NS) 0.9 % injection 3 mL  3 mL Intravenous Q12H Sundil, Subrina, MD   3 mL at 10/19/24 0933   sodium chloride  flush (NS) 0.9 % injection 3 mL  3 mL Intravenous PRN Sundil, Subrina, MD          Review of Systems: 10 systems reviewed and negative except per interval history/subjective  Physical Exam: Vitals:   10/19/24 0012 10/19/24 0443  BP: 130/81 134/84  Pulse:  67 60  Resp:    Temp:    SpO2: 98% 98%   No intake/output data recorded. No intake or output data in the 24 hours ending 10/19/24 1036 Constitutional: well-appearing, no acute distress, morbidly obese ENMT: ears and nose without scars or lesions, MMM CV: normal rate, warm edema in the bilateral lower extremities Respiratory: clear to auscultation, normal work of breathing Gastrointestinal: soft, non-tender, no palpable masses or hernias Skin: Bilateral venous stasis changes with some erythema on the  left shin, otherwise no visible lesions or rashes Psych: alert, judgement/insight appropriate, appropriate mood and affect   Test Results I personally reviewed new and old clinical labs and radiology tests Lab Results  Component Value Date   NA 134 (L) 10/19/2024   K 4.5 10/19/2024   CL 101 10/19/2024   CO2 19 (L) 10/19/2024   BUN 69 (H) 10/19/2024   CREATININE 4.36 (H) 10/19/2024   CALCIUM 8.0 (L) 10/19/2024   ALBUMIN 2.9 (L) 10/19/2024   PHOS 5.3 (H) 10/19/2024    CBC Recent Labs  Lab 10/15/24 1830 10/17/24 0319  WBC 8.7 9.2  HGB 13.7 12.4*  HCT 41.5 37.7*  MCV 86.3 85.1  PLT 258 238

## 2024-10-19 NOTE — Plan of Care (Signed)

## 2024-10-19 NOTE — Consult Note (Signed)
 "     Chief Complaint: Patient was seen in consultation today for  Chief Complaint  Patient presents with   Shortness of Breath   Leg Swelling   whoaReferring Physician(s): Dr. Macel  Supervising Physician: Jennefer Rover  Patient Status: [facility] - In-pt  History of Present Illness: Bruce Marshall is a 49 y.o. male with a medical history significant for atrial fibrillation, HTN, CKD stage II, obesity, chronic bilateral lower extremity edema and sleep apnea. He presented to the [facility] ED 10/15/24 with shortness of breath and left lower extremity redness/swelling. He had been recently diagnosed with cellulitis and was started on antibiotics however the redness/swelling had begun to spread. He was admitted for cellulitis and AKI on CKD. Nephrology was consulted and there is concern for active glomerular disease. His creatinine level has had a steady upward rise in the past few days. Additional lab work up revealed markedly elevated kappa and lambda free light chains. Interventional Radiology has been asked to evaluate this patient for an image-guided non-focal renal biopsy for further work up.      Past Medical History:  Diagnosis Date   A-fib (HCC)    Chest pain    Edema    lower extremitiy   Groin pain    Hypertension    borderline   Sleep apnea    no CPAP    Past Surgical History:  Procedure Laterality Date   CHOLECYSTECTOMY N/A 09/23/2023   Procedure: LAPAROSCOPIC CHOLECYSTECTOMY;  Surgeon: Polly Cordella LABOR, MD;  Location: [facility] OR;  Service: General;  Laterality: N/A;   COLONOSCOPY WITH PROPOFOL  N/A 12/19/2022   Procedure: COLONOSCOPY WITH PROPOFOL ;  Surgeon: Albertus Gordy HERO, MD;  Location: [facility] ENDOSCOPY;  Service: Gastroenterology;  Laterality: N/A;   PILONIDAL CYST DRAINAGE     POLYPECTOMY  12/19/2022   Procedure: POLYPECTOMY;  Surgeon: Albertus Gordy HERO, MD;  Location: [facility] ENDOSCOPY;  Service: Gastroenterology;;   WRIST SURGERY Right     Allergies: Patient has no known  allergies.  Medications: Prior to Admission medications  Medication Sig Start Date End Date Taking? Authorizing Provider  flecainide  (TAMBOCOR ) 150 MG tablet Take 1 tablet (150 mg total) by mouth 2 (two) times daily. 07/19/24  Yes Fenton, Clint R, PA  ibuprofen (ADVIL) 800 MG tablet Take 800 mg by mouth every 6 (six) hours as needed for moderate pain (pain score 4-6).   Yes [provider]  metoprolol  tartrate (LOPRESSOR ) 25 MG tablet Take 1 tablet (25 mg total) by mouth 2 (two) times daily. 06/28/24  Yes Fenton, Clint R, PA  apixaban  (ELIQUIS ) 5 MG TABS tablet Take 1 tablet (5 mg total) by mouth 2 (two) times daily. Patient not taking: No sig reported 02/24/23   Terra Fairy PARAS, PA-C  furosemide  (LASIX ) 20 MG tablet Take 1 tablet (20 mg total) by mouth daily as needed (leg swelling). Patient not taking: No sig reported 12/09/22   Santo Stanly LABOR, MD     Family History  Problem Relation Age of Onset   Healthy Daughter    Heart attack Maternal Uncle    Cancer Neg Hx    Stroke Neg Hx    Colon cancer Neg Hx    Esophageal cancer Neg Hx     Social History   Socioeconomic History   Marital status: Married    Spouse name: Not on file   Number of children: Not on file   Years of education: Not on file   Highest education level: Not on file  Occupational  History   Not on file  Tobacco Use   Smoking status: Never   Smokeless tobacco: Never   Tobacco comments:    Never smoke 11/06/22  Vaping Use   Vaping status: Never Used  Substance and Sexual Activity   Alcohol use: Not Currently   Drug use: Not Currently   Sexual activity: Not on file  Other Topics Concern   Not on file  Social History Narrative   Not on file   Social Drivers of Health   Tobacco Use: Low Risk (10/16/2024)   Patient History    Smoking Tobacco Use: Never    Smokeless Tobacco Use: Never    Passive Exposure: Not on file  Financial Resource Strain: Low Risk (10/20/2023)   Overall Financial  Resource Strain (CARDIA)    Difficulty of Paying Living Expenses: Not hard at all  Food Insecurity: No Food Insecurity (10/17/2024)   Epic    Worried About Radiation Protection Practitioner of Food in the Last Year: Never true    Ran Out of Food in the Last Year: Never true  Transportation Needs: No Transportation Needs (10/17/2024)   Epic    Lack of Transportation (Medical): No    Lack of Transportation (Non-Medical): No  Physical Activity: Inactive (10/20/2023)   Exercise Vital Sign    Days of Exercise per Week: 0 days    Minutes of Exercise per Session: 0 min  Stress: No Stress Concern Present (10/20/2023)   [employer] of Occupational Health - Occupational Stress Questionnaire    Feeling of Stress : Only a little  Social Connections: Moderately Integrated (10/20/2023)   Social Connection and Isolation Panel    Frequency of Communication with Friends and Family: More than three times a week    Frequency of Social Gatherings with Friends and Family: Once a week    Attends Religious Services: More than 4 times per year    Active Member of Clubs or Organizations: No    Attends Banker Meetings: Never    Marital Status: Married  Depression (PHQ2-9): Low Risk (10/20/2023)   Depression (PHQ2-9)    PHQ-2 Score: 0  Alcohol Screen: Low Risk (10/20/2023)   Alcohol Screen    Last Alcohol Screening Score (AUDIT): 1  Housing: Low Risk (10/17/2024)   Epic    Unable to Pay for Housing in the Last Year: No    Number of Times Moved in the Last Year: 0    Homeless in the Last Year: No  Utilities: Not At Risk (10/17/2024)   Epic    Threatened with loss of utilities: No  Health Literacy: Adequate Health Literacy (10/20/2023)   B1300 Health Literacy    Frequency of need for help with medical instructions: Never    Review of Systems: A 12 point ROS discussed and pertinent positives are indicated in the HPI above.  All other systems are negative.  Review of Systems  Respiratory:  Positive for chest tightness and  shortness of breath.   Cardiovascular:  Positive for leg swelling.  Gastrointestinal:  Positive for nausea.  Skin:  Positive for rash and wound.       LLE cellulitis    Vital Signs: BP 134/84 (BP Location: Left Arm)   Pulse 60   Temp 98.1 F (36.7 C)   Resp 20   Ht 5' 6 (1.676 m)   Wt (!) 324 lb 1.2 oz (147 kg)   SpO2 98%   BMI 52.31 kg/m   Physical Exam Constitutional:  General: He is not in acute distress.    Appearance: He is obese.  HENT:     Mouth/Throat:     Mouth: Mucous membranes are moist.     Pharynx: Oropharynx is clear.  Cardiovascular:     Rate and Rhythm: Normal rate.  Pulmonary:     Effort: Pulmonary effort is normal.  Abdominal:     Tenderness: There is no abdominal tenderness.  Musculoskeletal:     Right lower leg: Edema present.     Left lower leg: Edema present.     Comments: Left lower extremity cellulitis.   Skin:    General: Skin is warm and dry.  Neurological:     Mental Status: He is alert and oriented to person, place, and time.     Labs:  CBC: Recent Labs    10/09/24 1004 10/15/24 1830 10/17/24 0319  WBC 11.7* 8.7 9.2  HGB 16.2 13.7 12.4*  HCT 48.8 41.5 37.7*  PLT 213 258 238    COAGS: No results for input(s): INR, APTT in the last 8760 hours.  BMP: Recent Labs    10/15/24 1830 10/17/24 0319 10/18/24 0341 10/19/24 0427  NA 137 136 138 134*  K 3.8 3.8 4.3 4.5  CL 103 105 104 101  CO2 22 20* 21* 19*  GLUCOSE 107* 100* 88 100*  BUN 45* 52* 60* 69*  CALCIUM 8.2* 8.0* 7.9* 8.0*  CREATININE 2.04* 2.40* 3.13* 4.36*  GFRNONAA 39* 32* 23* 16*    LIVER FUNCTION TESTS: Recent Labs    10/09/24 1004 10/18/24 0341 10/19/24 0427  BILITOT 0.5 0.2  --   AST 22 21  --   ALT 20 19  --   ALKPHOS 87 60  --   PROT 7.9 6.5  --   ALBUMIN 3.8 2.9* 2.9*    TUMOR MARKERS: No results for input(s): AFPTM, CEA, CA199, CHROMGRNA in the last 8760 hours.  Assessment and Plan:  Acute kidney injury:  Bruce Marshall, 49 year old male, is tentatively scheduled 10/20/24 for a non-focal renal biopsy. The patient may also need a temporary dialysis catheter placed while he is down in IR for the biopsy but this is dependent on tomorrow's labs. Both of these procedures were discussed with the patient at the bedside and consent was obtained for renal biopsy only. Patient stated he would sign the consent for a temporary dialysis catheter if/when the decision is reached that he needs one placed.   Risks and benefits of non-focal renal biopsy were discussed with the patient and/or patient's family including, but not limited to bleeding, infection, damage to adjacent structures or low yield requiring additional tests.  All of the questions were answered and there is agreement to proceed. He will be NPO at midnight. Lovenox  has been held.   Consent signed and in IR.  Thank you for this interesting consult.  I greatly enjoyed meeting Bruce Marshall and look forward to participating in their care.  A copy of this report was sent to the requesting provider on this date.  Electronically Signed: Zarrah Loveland, AGACNP-BC 10/19/2024, 4:03 PM   I spent a total of 20 Minutes    in face to face in clinical consultation, greater than 50% of which was counseling/coordinating care for acute kidney injury.   "

## 2024-10-20 ENCOUNTER — Inpatient Hospital Stay (HOSPITAL_COMMUNITY)

## 2024-10-20 DIAGNOSIS — L03116 Cellulitis of left lower limb: Secondary | ICD-10-CM | POA: Diagnosis not present

## 2024-10-20 LAB — PROTIME-INR
INR: 1.1 (ref 0.8–1.2)
Prothrombin Time: 15 s (ref 11.4–15.2)

## 2024-10-20 LAB — RENAL FUNCTION PANEL
Albumin: 3 g/dL — ABNORMAL LOW (ref 3.5–5.0)
Anion gap: 13 (ref 5–15)
BUN: 74 mg/dL — ABNORMAL HIGH (ref 6–20)
CO2: 20 mmol/L — ABNORMAL LOW (ref 22–32)
Calcium: 8.2 mg/dL — ABNORMAL LOW (ref 8.9–10.3)
Chloride: 103 mmol/L (ref 98–111)
Creatinine, Ser: 4.41 mg/dL — ABNORMAL HIGH (ref 0.61–1.24)
GFR, Estimated: 15 mL/min — ABNORMAL LOW
Glucose, Bld: 93 mg/dL (ref 70–99)
Phosphorus: 5.6 mg/dL — ABNORMAL HIGH (ref 2.5–4.6)
Potassium: 4.5 mmol/L (ref 3.5–5.1)
Sodium: 136 mmol/L (ref 135–145)

## 2024-10-20 LAB — CBC
HCT: 38.8 % — ABNORMAL LOW (ref 39.0–52.0)
Hemoglobin: 12.9 g/dL — ABNORMAL LOW (ref 13.0–17.0)
MCH: 28.2 pg (ref 26.0–34.0)
MCHC: 33.2 g/dL (ref 30.0–36.0)
MCV: 84.7 fL (ref 80.0–100.0)
Platelets: 282 10*3/uL (ref 150–400)
RBC: 4.58 MIL/uL (ref 4.22–5.81)
RDW: 14 % (ref 11.5–15.5)
WBC: 11.3 10*3/uL — ABNORMAL HIGH (ref 4.0–10.5)
nRBC: 0 % (ref 0.0–0.2)

## 2024-10-20 LAB — IMMUNOFIXATION ELECTROPHORESIS
IgA: 783 mg/dL — ABNORMAL HIGH (ref 90–386)
IgG (Immunoglobin G), Serum: 1640 mg/dL — ABNORMAL HIGH (ref 603–1613)
IgM (Immunoglobulin M), Srm: 76 mg/dL (ref 20–172)
Total Protein ELP: 7 g/dL (ref 6.0–8.5)

## 2024-10-20 MED ORDER — LIDOCAINE HCL (PF) 1 % IJ SOLN
10.0000 mL | Freq: Once | INTRAMUSCULAR | Status: AC
  Administered 2024-10-20: 10 mL via INTRADERMAL

## 2024-10-20 MED ORDER — MIDAZOLAM HCL (PF) 2 MG/2ML IJ SOLN
INTRAMUSCULAR | Status: AC | PRN
  Administered 2024-10-20 (×2): .5 mg via INTRAVENOUS
  Administered 2024-10-20 (×2): 1 mg via INTRAVENOUS

## 2024-10-20 MED ORDER — GELATIN ABSORBABLE 12-7 MM EX MISC
1.0000 | Freq: Once | CUTANEOUS | Status: AC
  Administered 2024-10-20: 1 via TOPICAL

## 2024-10-20 MED ORDER — FENTANYL CITRATE (PF) 100 MCG/2ML IJ SOLN
INTRAMUSCULAR | Status: AC
  Filled 2024-10-20: qty 4

## 2024-10-20 MED ORDER — FENTANYL CITRATE (PF) 100 MCG/2ML IJ SOLN
INTRAMUSCULAR | Status: AC | PRN
  Administered 2024-10-20: 25 ug via INTRAVENOUS
  Administered 2024-10-20: 50 ug via INTRAVENOUS
  Administered 2024-10-20 (×2): 25 ug via INTRAVENOUS

## 2024-10-20 MED ORDER — MIDAZOLAM HCL 2 MG/2ML IJ SOLN
INTRAMUSCULAR | Status: AC
  Filled 2024-10-20: qty 4

## 2024-10-20 NOTE — Plan of Care (Signed)

## 2024-10-20 NOTE — Progress Notes (Addendum)
 " PROGRESS NOTE    Bruce Marshall  FMW:[id number] DOB: February 18, 1976 DOA: 10/15/2024 PCP: Tanda Bleacher, MD   Brief Narrative: 49 year old with past medical history significant for paroxysmal A-fib not on anticoagulation due to low CHADS2 score, obstructive sleep apnea, 6 CPAP at home, CKD stage II presents complaining of worsening leg swelling redness and left lower extremity.  He was diagnosed with cellulitis and started on antibiotics as an outpatient but redness and swelling has not improved.  Presented for further evaluation.  Evaluation consistent with a creatinine of 2.0, proBNP 1300, chest x-ray no acute disease process.  CT left lower extremity shows subcutaneous edema and swelling inside left lower leg, no evidence of abscess deep fascial plane edema or soft tissue air.  Significant events: Admitted 10/15/2024 left leg cellulitis and AKI 10-17-2024 left LE U/S negative for DVT 10-18-2024 nephrology consulted for worsening AKI, cards consulted for SOB/elevated BNP 10-19-2024 nephrology planning on renal biopsy on 10-20-2024      Assessment & Plan:   Principal Problem:   Cellulitis of left lower extremity Active Problems:   Acute kidney injury superimposed on chronic kidney disease   Elevated brain natriuretic peptide (BNP) level   Paroxysmal atrial fibrillation (HCC)   Morbid obesity with BMI of 50.0-59.9, adult (HCC)   Essential hypertension   CKD (chronic kidney disease), stage II - Baseline scr around 1.3   Obstructive sleep apnea   Typical atrial flutter (HCC)   1-Cellulitis Left lower extremity: Patient failed outpatient Augmentin, he stated that he got scratched by his dog nails. -Doppler negative for DVT - Continue IV Rocephin  and Zyvox  -Improving.   AKI superimposed on CKD stage II baseline 1.3: Patient presented with a creatinine of 2.0. Differential diagnosis ATN, versus glomerular disease. He developed shortness of breath received IV Lasix  noted to have  elevated BNP. Nephrology consulted, plan is to proceed with renal biopsy Underwent renal biopsy today Appreciate nephrology follow-up Creatinine of late has plateaued to 4.4  Elevated BNP: Echo normal left ventricular ejection fraction no diastolic dysfunction cardiology consulted no further workup recommended.  They have signed off.   Obstructive sleep apnea: Continue CPAP  Hypertension: Continue Lopressor   Morbid obesity BMI 50 Need lifestyle modification  Paroxysmal A-fib Continue Lopressor  and flecainide           Estimated body mass index is 53.72 kg/m as calculated from the following:   Height as of this encounter: 5' 6 (1.676 m).   Weight as of this encounter: 151 kg.   DVT prophylaxis: SCDs Code Status: Full code Family Communication: Discussed with patient Disposition Plan:  Status is: Inpatient Remains inpatient appropriate because: Management of AKI and cellulitis    Consultants:  Nephrology  Procedures:  Renal biopsy  Antimicrobials:    Subjective: He is lying down in bed, came from biopsy, he needs to use the bathroom.  Lower extremity redness and swelling improved  Objective: Vitals:   10/20/24 0451 10/20/24 0818 10/20/24 0827 10/20/24 0835  BP:  (!) 150/85 (!) 148/83 (!) 148/87  Pulse:  66 65 66  Resp:  16 14 14   Temp:      TempSrc:      SpO2:  99% 97% 95%  Weight: (!) 151 kg     Height:       No intake or output data in the 24 hours ending 10/20/24 0840 Filed Weights   10/15/24 1819 10/20/24 0451  Weight: (!) 147 kg (!) 151 kg    Examination:  General exam: Appears calm  and comfortable  Respiratory system: Clear to auscultation. Respiratory effort normal. Cardiovascular system: S1 & S2 heard, RRR. No JVD, murmurs, rubs, gallops or clicks. No pedal edema. Gastrointestinal system: Abdomen is nondistended, soft and nontender. No organomegaly or masses felt. Normal bowel sounds heard. Central nervous system: Alert and  oriented. No focal neurological deficits. Extremities: left LE with improved redness   Data Reviewed: I have personally reviewed following labs and imaging studies  CBC: Recent Labs  Lab 10/15/24 1830 10/17/24 0319 10/20/24 0339  WBC 8.7 9.2 11.3*  HGB 13.7 12.4* 12.9*  HCT 41.5 37.7* 38.8*  MCV 86.3 85.1 84.7  PLT 258 238 282   Basic Metabolic Panel: Recent Labs  Lab 10/15/24 1830 10/17/24 0319 10/18/24 0341 10/19/24 0427 10/20/24 0339  NA 137 136 138 134* 136  K 3.8 3.8 4.3 4.5 4.5  CL 103 105 104 101 103  CO2 22 20* 21* 19* 20*  GLUCOSE 107* 100* 88 100* 93  BUN 45* 52* 60* 69* 74*  CREATININE 2.04* 2.40* 3.13* 4.36* 4.41*  CALCIUM 8.2* 8.0* 7.9* 8.0* 8.2*  MG  --   --  2.0  --   --   PHOS  --   --   --  5.3* 5.6*   GFR: Estimated Creatinine Clearance: 27.7 mL/min (A) (by C-G formula based on SCr of 4.41 mg/dL (H)). Liver Function Tests: Recent Labs  Lab 10/18/24 0341 10/19/24 0427 10/20/24 0339  AST 21  --   --   ALT 19  --   --   ALKPHOS 60  --   --   BILITOT 0.2  --   --   PROT 6.5  --   --   ALBUMIN 2.9* 2.9* 3.0*   No results for input(s): LIPASE, AMYLASE in the last 168 hours. No results for input(s): AMMONIA in the last 168 hours. Coagulation Profile: Recent Labs  Lab 10/20/24 0339  INR 1.1   Cardiac Enzymes: No results for input(s): CKTOTAL, CKMB, CKMBINDEX, TROPONINI in the last 168 hours. BNP (last 3 results) Recent Labs    10/15/24 1830  PROBNP 1,393.0*   HbA1C: No results for input(s): HGBA1C in the last 72 hours. CBG: No results for input(s): GLUCAP in the last 168 hours. Lipid Profile: No results for input(s): CHOL, HDL, LDLCALC, TRIG, CHOLHDL, LDLDIRECT in the last 72 hours. Thyroid  Function Tests: No results for input(s): TSH, T4TOTAL, FREET4, T3FREE, THYROIDAB in the last 72 hours. Anemia Panel: No results for input(s): VITAMINB12, FOLATE, FERRITIN, TIBC, IRON,  RETICCTPCT in the last 72 hours. Sepsis Labs: Recent Labs  Lab 10/15/24 1836  LATICACIDVEN 0.8    Recent Results (from the past 240 hours)  Culture, blood (Routine X 2) w Reflex to ID Panel     Status: None (Preliminary result)   Collection Time: 10/16/24  5:39 AM   Specimen: BLOOD  Result Value Ref Range Status   Specimen Description BLOOD SITE NOT SPECIFIED  Final   Special Requests   Final    BOTTLES DRAWN AEROBIC AND ANAEROBIC Blood Culture results may not be optimal due to an inadequate volume of blood received in culture bottles   Culture   Final    NO GROWTH 4 DAYS Performed at [facility], [address] [place], [place] [address]    Report Status PENDING  Incomplete  Culture, blood (Routine X 2) w Reflex to ID Panel     Status: None (Preliminary result)   Collection Time: 10/16/24  5:39 AM  Specimen: BLOOD  Result Value Ref Range Status   Specimen Description BLOOD SITE NOT SPECIFIED  Final   Special Requests   Final    BOTTLES DRAWN AEROBIC AND ANAEROBIC Blood Culture results may not be optimal due to an inadequate volume of blood received in culture bottles   Culture   Final    NO GROWTH 4 DAYS Performed at [facility], [address], [place], [place] [address]    Report Status PENDING  Incomplete         Radiology Studies: US  RENAL Result Date: 10/18/2024 CLINICAL DATA:  [id number] AKI (acute kidney injury) [id number] EXAM: RENAL / URINARY TRACT ULTRASOUND COMPLETE COMPARISON:  September 23, 2023 FINDINGS: Right Kidney: Renal measurements: 12.8 x 6.3 x 7.1 cm = volume: 299 mL. Echogenicity within normal limits. No mass or hydronephrosis visualized. Left Kidney: Renal measurements: 12.3 x 7.0 x 5.9 cm = volume: 269 mL. Echogenicity within normal limits. No definitive mass or hydronephrosis visualized. Portions are suboptimally assessed secondary to shadowing bowel gas. Bladder: Appears normal for degree of bladder distention. Other: Increased hepatic  echogenicity. IMPRESSION: 1. No hydronephrosis. 2. Increased hepatic echogenicity as can be seen in hepatic steatosis. Electronically Signed   By: Corean Salter M.D.   On: 10/18/2024 14:19        Scheduled Meds:  [START ON 10/22/2024] enoxaparin  (LOVENOX ) injection  40 mg Subcutaneous Q24H   flecainide   150 mg Oral BID   linezolid   600 mg Oral Q12H   metoprolol  tartrate  25 mg Oral BID   sodium chloride  flush  3 mL Intravenous Q12H   sodium chloride  flush  3 mL Intravenous Q12H   Continuous Infusions:  cefTRIAXone  (ROCEPHIN )  IV 2 g (10/20/24 0056)     LOS: 4 days    Time spent: 35 Minutes    Tomie Spizzirri A Anikah Hogge, MD [facility]   If 7PM-7AM, please contact night-coverage [email or web address]  10/20/2024, 8:40 AM   "

## 2024-10-20 NOTE — Progress Notes (Signed)
 Nephrology Follow-Up Consult note   Assessment/Recommendations: Bruce Marshall is a/an 49 y.o. male with a past medical history significant for severe obesity, osa, afib who present w/ AKI and cellulitis   AKI: Baseline creatinine around 1-1.3.  Rising creatinine at this time.  Significant hematuria and proteinuria.  Also reported history of hematuria in the remote past with negative urological evaluation.   ATN is possible but I am definitely concerned about active glomerular disease particularly IgA.  So far serologies are unrevealing except for mildly decreased C3 -Serologies mostly negative -Appreciate help from IR; biopsy on 2/4 -Fortunately Crt stabilizing; held off on catheter placement -Would hold further diuresis for now -Continue to monitor daily Cr, Dose meds for GFR -Monitor Daily I/Os, Daily weight  -Maintain MAP>65 for optimal renal perfusion.  -Avoid nephrotoxic medications including NSAIDs -Use synthetic opioids (Fentanyl /Dilaudid ) if needed   Chest heaviness/shortness of breath: Symptoms concerning for possible angina based on history.  Agree with cardiology that signs of volume overload are mixed and likely not severe.  However, if anginal symptoms or SOB persist may benefit from RHC.   OSA: CPAP per primary team   Cellulitis: Antibiotics per primary team   Recommendations conveyed to primary service.    [facility] 10/20/2024 1:00 PM  ___________________________________________________________  CC: Shortness of breath  Interval History/Subjective: Feels well today. No sob. Lying flat. Biopsy went well today, Feels like he urinated well yesterday   Medications:  Current Facility-Administered Medications  Medication Dose Route Frequency Provider Last Rate Last Admin   acetaminophen  (TYLENOL ) tablet 1,000 mg  1,000 mg Oral Q6H PRN Laurence Locus, DO       Or   acetaminophen  (TYLENOL ) suppository 650 mg  650 mg Rectal Q6H PRN  Laurence Locus, DO       cefTRIAXone  (ROCEPHIN ) 2 g in sodium chloride  0.9 % 100 mL IVPB  2 g Intravenous Q24H Sundil, Subrina, MD 200 mL/hr at 10/20/24 0056 2 g at 10/20/24 0056   [START ON 10/22/2024] enoxaparin  (LOVENOX ) injection 40 mg  40 mg Subcutaneous Q24H Laurence Locus, DO       flecainide  (TAMBOCOR ) tablet 150 mg  150 mg Oral BID Sundil, Subrina, MD   150 mg at 10/20/24 1001   linezolid  (ZYVOX ) tablet 600 mg  600 mg Oral Q12H Jimenez, Aileen, RPH   600 mg at 10/20/24 1000   metoprolol  tartrate (LOPRESSOR ) tablet 25 mg  25 mg Oral BID Sundil, Subrina, MD   25 mg at 10/20/24 1000   ondansetron  (ZOFRAN ) tablet 4 mg  4 mg Oral Q6H PRN Sundil, Subrina, MD       Or   ondansetron  (ZOFRAN ) injection 4 mg  4 mg Intravenous Q6H PRN Lee, Subrina, MD       sodium chloride  flush (NS) 0.9 % injection 3 mL  3 mL Intravenous Q12H Sundil, Subrina, MD   3 mL at 10/20/24 1116   sodium chloride  flush (NS) 0.9 % injection 3 mL  3 mL Intravenous Q12H Sundil, Subrina, MD   3 mL at 10/20/24 1001   sodium chloride  flush (NS) 0.9 % injection 3 mL  3 mL Intravenous PRN Sundil, Subrina, MD          Review of Systems: 10 systems reviewed and negative except per interval history/subjective  Physical Exam: Vitals:   10/20/24 0915 10/20/24 0930  BP: 137/88 (!) 125/90  Pulse: 72 69  Resp: 18 17  Temp:    SpO2: 95% 96%   No intake/output data  recorded. No intake or output data in the 24 hours ending 10/20/24 1300 Constitutional: well-appearing, no acute distress, morbidly obese ENMT: ears and nose without scars or lesions, MMM CV: normal rate, 1+ edema in ble Respiratory: bilateral chest rise, normal work of breathing Gastrointestinal: soft, non-tender, no palpable masses or hernias Skin: Bilateral venous stasis changes with some erythema on the left shin, otherwise no visible lesions or rashes Psych: alert, judgement/insight appropriate, appropriate mood and affect   Test Results I personally reviewed new  and old clinical labs and radiology tests Lab Results  Component Value Date   NA 136 10/20/2024   K 4.5 10/20/2024   CL 103 10/20/2024   CO2 20 (L) 10/20/2024   BUN 74 (H) 10/20/2024   CREATININE 4.41 (H) 10/20/2024   CALCIUM 8.2 (L) 10/20/2024   ALBUMIN 3.0 (L) 10/20/2024   PHOS 5.6 (H) 10/20/2024    CBC Recent Labs  Lab 10/15/24 1830 10/17/24 0319 10/20/24 0339  WBC 8.7 9.2 11.3*  HGB 13.7 12.4* 12.9*  HCT 41.5 37.7* 38.8*  MCV 86.3 85.1 84.7  PLT 258 238 282

## 2024-10-20 NOTE — Procedures (Signed)
 Interventional Radiology Procedure:   Indications: AKI  Procedure: US  guided core biopsy of right kidney  Findings: 3 core biopsies from right kidney lower pole.  Gelfoam slurry injected along biopsy tract.    Complications: None     EBL: Minimal  Plan: Bedrest 4 hours   Attie Nawabi R. Philip, MD  Pager: [phone]

## 2024-10-21 DIAGNOSIS — L03116 Cellulitis of left lower limb: Secondary | ICD-10-CM | POA: Diagnosis not present

## 2024-10-21 LAB — RENAL FUNCTION PANEL
Albumin: 3.1 g/dL — ABNORMAL LOW (ref 3.5–5.0)
Anion gap: 11 (ref 5–15)
BUN: 78 mg/dL — ABNORMAL HIGH (ref 6–20)
CO2: 23 mmol/L (ref 22–32)
Calcium: 8.1 mg/dL — ABNORMAL LOW (ref 8.9–10.3)
Chloride: 103 mmol/L (ref 98–111)
Creatinine, Ser: 4.57 mg/dL — ABNORMAL HIGH (ref 0.61–1.24)
GFR, Estimated: 15 mL/min — ABNORMAL LOW
Glucose, Bld: 97 mg/dL (ref 70–99)
Phosphorus: 5.5 mg/dL — ABNORMAL HIGH (ref 2.5–4.6)
Potassium: 5.2 mmol/L — ABNORMAL HIGH (ref 3.5–5.1)
Sodium: 137 mmol/L (ref 135–145)

## 2024-10-21 LAB — CULTURE, BLOOD (ROUTINE X 2)
Culture: NO GROWTH
Culture: NO GROWTH

## 2024-10-21 MED ORDER — SODIUM ZIRCONIUM CYCLOSILICATE 5 G PO PACK
5.0000 g | PACK | Freq: Once | ORAL | Status: AC
  Administered 2024-10-21: 5 g via ORAL
  Filled 2024-10-21: qty 1

## 2024-10-21 MED ORDER — SODIUM CHLORIDE 0.9 % IV SOLN
500.0000 mg | Freq: Every day | INTRAVENOUS | Status: AC
  Administered 2024-10-21 – 2024-10-22 (×2): 500 mg via INTRAVENOUS
  Filled 2024-10-21 (×3): qty 500

## 2024-10-21 MED ORDER — FAMOTIDINE 20 MG PO TABS
20.0000 mg | ORAL_TABLET | Freq: Every day | ORAL | Status: AC
  Administered 2024-10-21 – 2024-10-22 (×2): 20 mg via ORAL
  Filled 2024-10-21 (×2): qty 1

## 2024-10-21 NOTE — Progress Notes (Addendum)
 " PROGRESS NOTE    Bruce Marshall  FMW:[id number] DOB: Aug 14, 1976 DOA: 10/15/2024 PCP: Tanda Bleacher, MD   Brief Narrative: 49 year old with past medical history significant for paroxysmal A-fib not on anticoagulation due to low CHADS2 score, obstructive sleep apnea, 6 CPAP at home, CKD stage II presents complaining of worsening leg swelling redness and left lower extremity.  He was diagnosed with cellulitis and started on antibiotics as an outpatient but redness and swelling has not improved.  Presented for further evaluation.  Evaluation consistent with a creatinine of 2.0, proBNP 1300, chest x-ray no acute disease process.  CT left lower extremity shows subcutaneous edema and swelling inside left lower leg, no evidence of abscess deep fascial plane edema or soft tissue air.  Significant events: Admitted 10/15/2024 left leg cellulitis and AKI 10-17-2024 left LE U/S negative for DVT 10-18-2024 nephrology consulted for worsening AKI, cards consulted for SOB/elevated BNP 10-19-2024 nephrology planning on renal biopsy on 10-20-2024      Assessment & Plan:   Principal Problem:   Cellulitis of left lower extremity Active Problems:   Acute kidney injury superimposed on chronic kidney disease   Elevated brain natriuretic peptide (BNP) level   Paroxysmal atrial fibrillation (HCC)   Morbid obesity with BMI of 50.0-59.9, adult (HCC)   Essential hypertension   CKD (chronic kidney disease), stage II - Baseline scr around 1.3   Obstructive sleep apnea   Typical atrial flutter (HCC)   1-Cellulitis Left lower extremity: Patient failed outpatient Augmentin, he stated that he got scratched by his dog nails. -Doppler negative for DVT - Continue IV Rocephin  and Zyvox  -Improving.   AKI superimposed on CKD stage II baseline 1.3: Patient presented with a creatinine of 2.0. Differential diagnosis ATN, versus glomerular disease. He developed shortness of breath received IV Lasix  noted to have  elevated BNP. Nephrology consulted, plan is to proceed with renal biopsy Underwent renal biopsy 2/04, awaiting results.  Appreciate nephrology follow-up Creatinine range: 4.4--4.5 Discussed with staff, needs strict I and o/.   Mild Hyperkalemia; will give a dose of lokelma .   Elevated BNP: Echo normal left ventricular ejection fraction no diastolic dysfunction cardiology consulted no further workup recommended.  They have signed off.   Obstructive sleep apnea: Continue CPAP Plan to check nocturnal oxygen overnight on CPAP. He has been using oxygen in the hospital at [facility]  Hypertension: Continue Lopressor   Morbid obesity BMI 50 Need lifestyle modification  Paroxysmal A-fib Continue Lopressor  and flecainide           Estimated body mass index is 53.72 kg/m as calculated from the following:   Height as of this encounter: 5' 6 (1.676 m).   Weight as of this encounter: 151 kg.   DVT prophylaxis: SCDs Code Status: Full code Family Communication: Discussed with patient Disposition Plan:  Status is: Inpatient Remains inpatient appropriate because: Management of AKI and cellulitis    Consultants:  Nephrology  Procedures:  Renal biopsy  Antimicrobials:    Subjective: LE redness improved.  Report improvement of dyspnea. Feels better on oxygen.   Objective: Vitals:   10/20/24 1603 10/20/24 2000 10/20/24 2146 10/21/24 0500  BP: 113/75 139/79    Pulse: 63 65 65   Resp: 20 18 20    Temp: 98.4 F (36.9 C) 98.2 F (36.8 C)    TempSrc:      SpO2: 97% 95% 95%   Weight:    (!) 151 kg  Height:       No intake or output data in the  24 hours ending 10/21/24 0719 Filed Weights   10/15/24 1819 10/20/24 0451 10/21/24 0500  Weight: (!) 147 kg (!) 151 kg (!) 151 kg    Examination:  General exam: NAD Respiratory system: CTA Cardiovascular system: S 1, S 2 RRR Gastrointestinal system: BS present, sof,t nt Extremities: left LE with improved redness   Data  Reviewed: I have personally reviewed following labs and imaging studies  CBC: Recent Labs  Lab 10/15/24 1830 10/17/24 0319 10/20/24 0339  WBC 8.7 9.2 11.3*  HGB 13.7 12.4* 12.9*  HCT 41.5 37.7* 38.8*  MCV 86.3 85.1 84.7  PLT 258 238 282   Basic Metabolic Panel: Recent Labs  Lab 10/17/24 0319 10/18/24 0341 10/19/24 0427 10/20/24 0339 10/21/24 0125  NA 136 138 134* 136 137  K 3.8 4.3 4.5 4.5 5.2*  CL 105 104 101 103 103  CO2 20* 21* 19* 20* 23  GLUCOSE 100* 88 100* 93 97  BUN 52* 60* 69* 74* 78*  CREATININE 2.40* 3.13* 4.36* 4.41* 4.57*  CALCIUM 8.0* 7.9* 8.0* 8.2* 8.1*  MG  --  2.0  --   --   --   PHOS  --   --  5.3* 5.6* 5.5*   GFR: Estimated Creatinine Clearance: 26.7 mL/min (A) (by C-G formula based on SCr of 4.57 mg/dL (H)). Liver Function Tests: Recent Labs  Lab 10/18/24 0341 10/19/24 0427 10/20/24 0339 10/21/24 0125  AST 21  --   --   --   ALT 19  --   --   --   ALKPHOS 60  --   --   --   BILITOT 0.2  --   --   --   PROT 6.5  --   --   --   ALBUMIN 2.9* 2.9* 3.0* 3.1*   No results for input(s): LIPASE, AMYLASE in the last 168 hours. No results for input(s): AMMONIA in the last 168 hours. Coagulation Profile: Recent Labs  Lab 10/20/24 0339  INR 1.1   Cardiac Enzymes: No results for input(s): CKTOTAL, CKMB, CKMBINDEX, TROPONINI in the last 168 hours. BNP (last 3 results) Recent Labs    10/15/24 1830  PROBNP 1,393.0*   HbA1C: No results for input(s): HGBA1C in the last 72 hours. CBG: No results for input(s): GLUCAP in the last 168 hours. Lipid Profile: No results for input(s): CHOL, HDL, LDLCALC, TRIG, CHOLHDL, LDLDIRECT in the last 72 hours. Thyroid  Function Tests: No results for input(s): TSH, T4TOTAL, FREET4, T3FREE, THYROIDAB in the last 72 hours. Anemia Panel: No results for input(s): VITAMINB12, FOLATE, FERRITIN, TIBC, IRON, RETICCTPCT in the last 72 hours. Sepsis Labs: Recent Labs   Lab 10/15/24 1836  LATICACIDVEN 0.8    Recent Results (from the past 240 hours)  Culture, blood (Routine X 2) w Reflex to ID Panel     Status: None   Collection Time: 10/16/24  5:39 AM   Specimen: BLOOD  Result Value Ref Range Status   Specimen Description BLOOD SITE NOT SPECIFIED  Final   Special Requests   Final    BOTTLES DRAWN AEROBIC AND ANAEROBIC Blood Culture results may not be optimal due to an inadequate volume of blood received in culture bottles   Culture   Final    NO GROWTH 5 DAYS Performed at [facility], [address], [place], [place] [address]    Report Status 10/21/2024 FINAL  Final  Culture, blood (Routine X 2) w Reflex to ID Panel     Status: None  Collection Time: 10/16/24  5:39 AM   Specimen: BLOOD  Result Value Ref Range Status   Specimen Description BLOOD SITE NOT SPECIFIED  Final   Special Requests   Final    BOTTLES DRAWN AEROBIC AND ANAEROBIC Blood Culture results may not be optimal due to an inadequate volume of blood received in culture bottles   Culture   Final    NO GROWTH 5 DAYS Performed at [facility], [address], [place], [place] [address]    Report Status 10/21/2024 FINAL  Final         Radiology Studies: US  BIOPSY (KIDNEY) Result Date: 10/20/2024 INDICATION: 8 year old with acute kidney injury.  Request for a renal biopsy. EXAM: ULTRASOUND-GUIDED RANDOM RENAL BIOPSY MEDICATIONS: Moderate sedation ANESTHESIA/SEDATION: Moderate (conscious) sedation was employed during this procedure. A total of Versed  3 mg and Fentanyl  125 mcg was administered intravenously by the radiology nurse. Total intra-service moderate Sedation Time: 32 minutes. The patient's level of consciousness and vital signs were monitored continuously by radiology nursing throughout the procedure under my direct supervision. FLUOROSCOPY TIME:  None COMPLICATIONS: None immediate. PROCEDURE: Informed written consent was obtained from the patient after a  thorough discussion of the procedural risks, benefits and alternatives. All questions were addressed. A timeout was performed prior to the initiation of the procedure. Patient was placed prone. Both kidneys were evaluated with ultrasound. Right kidney was selected for biopsy. Right flank was prepped and draped in sterile fashion. Maximal barrier sterile technique was utilized including caps, mask, sterile gowns, sterile gloves, sterile drape, hand hygiene and skin antiseptic. Skin was anesthetized using 1% lidocaine . Small incision was made. Using ultrasound guidance, a 17 gauge coaxial needle was directed to the lower pole cortex. Three core biopsies were obtained from the lower pole cortex with an 18 gauge core device. Specimens placed in saline. Gel-Foam slurry was injected as the 17 gauge coaxial needle was retracted. Bandage placed over the puncture site. FINDINGS: The first 2 core biopsies may represent perinephric fat. Third core biopsy looked adequate. No immediate bleeding or hematoma formation. IMPRESSION: Successful ultrasound-guided right renal biopsy. Electronically Signed   By: Juliene Balder M.D.   On: 10/20/2024 10:54        Scheduled Meds:  [START ON 10/22/2024] enoxaparin  (LOVENOX ) injection  40 mg Subcutaneous Q24H   flecainide   150 mg Oral BID   linezolid   600 mg Oral Q12H   metoprolol  tartrate  25 mg Oral BID   sodium chloride  flush  3 mL Intravenous Q12H   sodium chloride  flush  3 mL Intravenous Q12H   Continuous Infusions:  cefTRIAXone  (ROCEPHIN )  IV 2 g (10/21/24 0053)     LOS: 5 days    Time spent: 35 Minutes    Toshi Ishii A Jamear Carbonneau, MD [facility]   If 7PM-7AM, please contact night-coverage [email or web address]  10/21/2024, 7:19 AM   "

## 2024-10-21 NOTE — Progress Notes (Signed)
 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 Some signs consistent with per infectious etiology supporting the idea that this glomerular inflammation was likely set off by underlying infection.  Given the severity of his disease, he would benefit from high dose steroids with methylprednisilone 500 mg times three doses followed by oral steroids. He does have active infection with cellulitis, but this seems to be improving and is mild in nature so I think he can tolerate high dose steroids.  Sometimes we consider cyclophosphamide in this situation, but given his kidney function is stabilizing. I think we can hold off on this. Could consider mycophenolate prior to discharge. On an outpatient setting, I would consider use of a novel agent.

## 2024-10-21 NOTE — Progress Notes (Signed)
 Pt placed on OVN Pulse Ox study. Pt currently has CPAP on but CPAP is on RA. RT will followup in the morning.

## 2024-10-21 NOTE — Progress Notes (Signed)
 Nephrology Follow-Up Consult note   Assessment/Recommendations: Bruce Marshall is a/an 49 y.o. male with a past medical history significant for severe obesity, osa, afib who present w/ AKI and cellulitis   AKI: Baseline creatinine around 1-1.3.  Rising creatinine at this time.  Significant hematuria and proteinuria.  Also reported history of hematuria in the remote past with negative urological evaluation.   ATN is possible but I am definitely concerned about active glomerular disease particularly IgA.  So far serologies are unrevealing except for mildly decreased C3 -Serologies mostly negative -Appreciate help from IR; biopsy on 2/4 -Fortunately Crt stabilizing; held off on dialysis for now - Holding dialysis -Follow-up biopsy results.  Hopefully in a state of recovery -Continue to monitor daily Cr, Dose meds for GFR -Monitor Daily I/Os, Daily weight  -Maintain MAP>65 for optimal renal perfusion.  -Avoid nephrotoxic medications including NSAIDs -Use synthetic opioids (Fentanyl /Dilaudid ) if needed   Chest heaviness/shortness of breath: Symptoms concerning for possible angina based on history.  Agree with cardiology that signs of volume overload are mixed and likely not severe.  However, if anginal symptoms or SOB persist may benefit from RHC.  A lot of the symptoms have improved with treatment of his OSA.   OSA: CPAP per primary team   Cellulitis: Antibiotics per primary team   Recommendations conveyed to primary service.    [facility] 10/21/2024 10:10 AM  ___________________________________________________________  CC: Shortness of breath  Interval History/Subjective: Continues to feel well today.  Feels like his urine output has been fairly good.  However he is drinking a good bit.  Still has some edema but feels like its improved.  Also feel like his cellulitis is improved.   Medications:  Current Facility-Administered Medications   Medication Dose Route Frequency Provider Last Rate Last Admin   acetaminophen  (TYLENOL ) tablet 1,000 mg  1,000 mg Oral Q6H PRN Laurence Locus, DO       Or   acetaminophen  (TYLENOL ) suppository 650 mg  650 mg Rectal Q6H PRN Laurence Locus, DO       cefTRIAXone  (ROCEPHIN ) 2 g in sodium chloride  0.9 % 100 mL IVPB  2 g Intravenous Q24H Sundil, Subrina, MD 200 mL/hr at 10/21/24 0053 2 g at 10/21/24 0053   [START ON 10/22/2024] enoxaparin  (LOVENOX ) injection 40 mg  40 mg Subcutaneous Q24H Laurence Locus, DO       flecainide  (TAMBOCOR ) tablet 150 mg  150 mg Oral BID Sundil, Subrina, MD   150 mg at 10/21/24 9057   linezolid  (ZYVOX ) tablet 600 mg  600 mg Oral Q12H Jimenez, Aileen, RPH   600 mg at 10/21/24 9057   metoprolol  tartrate (LOPRESSOR ) tablet 25 mg  25 mg Oral BID Sundil, Subrina, MD   25 mg at 10/21/24 9057   ondansetron  (ZOFRAN ) tablet 4 mg  4 mg Oral Q6H PRN Sundil, Subrina, MD       Or   ondansetron  (ZOFRAN ) injection 4 mg  4 mg Intravenous Q6H PRN Sundil, Subrina, MD       sodium chloride  flush (NS) 0.9 % injection 3 mL  3 mL Intravenous Q12H Sundil, Subrina, MD   3 mL at 10/20/24 2115   sodium chloride  flush (NS) 0.9 % injection 3 mL  3 mL Intravenous Q12H Sundil, Subrina, MD   3 mL at 10/20/24 2115   sodium chloride  flush (NS) 0.9 % injection 3 mL  3 mL Intravenous PRN Sundil, Subrina, MD          Review of Systems:  10 systems reviewed and negative except per interval history/subjective  Physical Exam: Vitals:   10/20/24 2146 10/21/24 0820  BP:  128/73  Pulse: 65 61  Resp: 20 20  Temp:  (!) 97.4 F (36.3 C)  SpO2: 95% 96%   No intake/output data recorded. No intake or output data in the 24 hours ending 10/21/24 1010 Constitutional: well-appearing, no acute distress, morbidly obese ENMT: ears and nose without scars or lesions, MMM CV: normal rate, 1+ edema in ble Respiratory: bilateral chest rise, normal work of breathing Gastrointestinal: soft, non-tender, no palpable masses or  hernias Skin: Bilateral venous stasis changes with some erythema on the left shin that seems to be improving, otherwise no visible lesions or rashes Psych: alert, judgement/insight appropriate, appropriate mood and affect   Test Results I personally reviewed new and old clinical labs and radiology tests Lab Results  Component Value Date   NA 137 10/21/2024   K 5.2 (H) 10/21/2024   CL 103 10/21/2024   CO2 23 10/21/2024   BUN 78 (H) 10/21/2024   CREATININE 4.57 (H) 10/21/2024   CALCIUM 8.1 (L) 10/21/2024   ALBUMIN 3.1 (L) 10/21/2024   PHOS 5.5 (H) 10/21/2024    CBC Recent Labs  Lab 10/15/24 1830 10/17/24 0319 10/20/24 0339  WBC 8.7 9.2 11.3*  HGB 13.7 12.4* 12.9*  HCT 41.5 37.7* 38.8*  MCV 86.3 85.1 84.7  PLT 258 238 282

## 2024-10-21 NOTE — Plan of Care (Signed)

## 2024-10-22 ENCOUNTER — Encounter (HOSPITAL_COMMUNITY): Payer: Self-pay

## 2024-10-22 LAB — RENAL FUNCTION PANEL
Albumin: 3.1 g/dL — ABNORMAL LOW (ref 3.5–5.0)
Anion gap: 14 (ref 5–15)
BUN: 78 mg/dL — ABNORMAL HIGH (ref 6–20)
CO2: 19 mmol/L — ABNORMAL LOW (ref 22–32)
Calcium: 8.1 mg/dL — ABNORMAL LOW (ref 8.9–10.3)
Chloride: 108 mmol/L (ref 98–111)
Creatinine, Ser: 4.32 mg/dL — ABNORMAL HIGH (ref 0.61–1.24)
GFR, Estimated: 16 mL/min — ABNORMAL LOW
Glucose, Bld: 169 mg/dL — ABNORMAL HIGH (ref 70–99)
Phosphorus: 4 mg/dL (ref 2.5–4.6)
Potassium: 5.4 mmol/L — ABNORMAL HIGH (ref 3.5–5.1)
Sodium: 141 mmol/L (ref 135–145)

## 2024-10-22 LAB — SURGICAL PATHOLOGY

## 2024-10-22 MED ORDER — CEFADROXIL 500 MG PO CAPS: 500.0000 mg | ORAL_CAPSULE | Freq: Two times a day (BID) | ORAL | Status: AC

## 2024-10-22 MED ORDER — SODIUM ZIRCONIUM CYCLOSILICATE 10 G PO PACK
10.0000 g | PACK | Freq: Once | ORAL | Status: AC
  Administered 2024-10-22: 10 g via ORAL
  Filled 2024-10-22: qty 1

## 2024-10-22 NOTE — Progress Notes (Signed)
 Nephrology Follow-Up Consult note   Assessment/Recommendations: Bruce Marshall is a/an 49 y.o. 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  Ultimately the biopsy demonstrating IgA with significant inflammation including crescent formation. -Continue with Solu-Medrol  500 mg x 3 doses; last dose on 2/7 -Then switch to oral prednisone  60 mg daily -Further titration of prednisone  in the outpatient setting -Will plan to switch to novel agent in the outpatient setting -Have considered mycophenolate or cyclophosphamide but given improving creatinine we will hold off on these medications -Monitor Daily I/Os, Daily weight  -Maintain MAP>65 for optimal renal perfusion.  -Avoid nephrotoxic medications including NSAIDs -Use synthetic opioids (Fentanyl /Dilaudid ) if needed  If the patient's kidney function is stable or improving tomorrow he would be okay for discharge with follow-up in the office   Chest heaviness/shortness of breath: Symptoms concerning for possible angina based on history.  Agree with cardiology that signs of volume overload are mixed and likely not severe.  However, if anginal symptoms or SOB persist may benefit from RHC.  A lot of the symptoms have improved with treatment of his OSA.   OSA: CPAP per primary team   Cellulitis: Currently on ceftriaxone  and linezolid .  Recommend switching to oral medications in preparation for discharge.  Hyperkalemia: Associated with AKI.  Should improve as AKI resolves.  Lokelma  again today.   Recommendations conveyed to primary service.    [facility] 10/22/2024 10:04 AM  ___________________________________________________________  CC: Shortness of breath  Interval History/Subjective: Patient continues to feel well.  Slept well.  Shortness of breath  improved.  Urinating well.   Medications:  Current Facility-Administered Medications  Medication Dose Route Frequency Provider Last Rate Last Admin   acetaminophen  (TYLENOL ) tablet 1,000 mg  1,000 mg Oral Q6H PRN Laurence Locus, DO       Or   acetaminophen  (TYLENOL ) suppository 650 mg  650 mg Rectal Q6H PRN Laurence Locus, DO       cefTRIAXone  (ROCEPHIN ) 2 g in sodium chloride  0.9 % 100 mL IVPB  2 g Intravenous Q24H Sundil, Subrina, MD 200 mL/hr at 10/22/24 0044 2 g at 10/22/24 0044   enoxaparin  (LOVENOX ) injection 40 mg  40 mg Subcutaneous Q24H Laurence Locus, DO       famotidine  (PEPCID ) tablet 20 mg  20 mg Oral Daily Malya Cirillo J, MD   20 mg at 10/22/24 9081   flecainide  (TAMBOCOR ) tablet 150 mg  150 mg Oral BID Sundil, Subrina, MD   150 mg at 10/22/24 0919   linezolid  (ZYVOX ) tablet 600 mg  600 mg Oral Q12H Jimenez, Aileen, RPH   600 mg at 10/22/24 9081   methylPREDNISolone  sodium succinate (SOLU-MEDROL ) 500 mg in sodium chloride  0.9 % 50 mL IVPB  500 mg Intravenous Daily Macel Jayson PARAS, MD 116 mL/hr at 10/22/24 0923 500 mg at 10/22/24 9076   metoprolol  tartrate (LOPRESSOR ) tablet 25 mg  25 mg Oral BID Sundil, Subrina, MD   25 mg at 10/22/24 9081   ondansetron  (ZOFRAN ) tablet 4 mg  4 mg Oral Q6H PRN Sundil, Subrina, MD       Or   ondansetron  (ZOFRAN ) injection 4 mg  4 mg Intravenous Q6H PRN Sundil, Subrina, MD       sodium chloride  flush (NS) 0.9 % injection 3 mL  3 mL Intravenous Q12H Sundil, Subrina, MD   3 mL at 10/22/24  0920   sodium chloride  flush (NS) 0.9 % injection 3 mL  3 mL Intravenous Q12H Sundil, Subrina, MD   3 mL at 10/22/24 0920   sodium chloride  flush (NS) 0.9 % injection 3 mL  3 mL Intravenous PRN Sundil, Subrina, MD          Review of Systems: 10 systems reviewed and negative except per interval history/subjective  Physical Exam: Vitals:   10/22/24 0419 10/22/24 0741  BP: 136/79 (!) 150/92  Pulse: (!) 57 60  Resp:    Temp: 97.6 F (36.4 C) 97.6 F (36.4 C)   SpO2: 96% 98%   No intake/output data recorded.  Intake/Output Summary (Last 24 hours) at 10/22/2024 1004 Last data filed at 10/21/2024 1813 Gross per 24 hour  Intake 1019 ml  Output 1010 ml  Net 9 ml   Constitutional: well-appearing, no acute distress, morbidly obese ENMT: ears and nose without scars or lesions, MMM CV: normal rate, 1+ edema in ble Respiratory: bilateral chest rise, normal work of breathing Gastrointestinal: soft, non-tender, no palpable masses or hernias Skin: Bilateral venous stasis changes with no erythema on LLE, otherwise no visible lesions or rashes Psych: alert, judgement/insight appropriate, appropriate mood and affect   Test Results I personally reviewed new and old clinical labs and radiology tests Lab Results  Component Value Date   NA 141 10/22/2024   K 5.4 (H) 10/22/2024   CL 108 10/22/2024   CO2 19 (L) 10/22/2024   BUN 78 (H) 10/22/2024   CREATININE 4.32 (H) 10/22/2024   CALCIUM 8.1 (L) 10/22/2024   ALBUMIN 3.1 (L) 10/22/2024   PHOS 4.0 10/22/2024    CBC Recent Labs  Lab 10/15/24 1830 10/17/24 0319 10/20/24 0339  WBC 8.7 9.2 11.3*  HGB 13.7 12.4* 12.9*  HCT 41.5 37.7* 38.8*  MCV 86.3 85.1 84.7  PLT 258 238 282

## 2024-10-22 NOTE — Progress Notes (Signed)
 " PROGRESS NOTE    Bruce Marshall  FMW:[id number] DOB: 1975-12-11 DOA: 10/15/2024 PCP: Tanda Bleacher, MD   Brief Narrative: 49 year old with past medical history significant for paroxysmal A-fib not on anticoagulation due to low CHADS2 score, obstructive sleep apnea, 6 CPAP at home, CKD stage II presents complaining of worsening leg swelling redness and left lower extremity.  He was diagnosed with cellulitis and started on antibiotics as an outpatient but redness and swelling has not improved.  Presented for further evaluation.  Evaluation consistent with a creatinine of 2.0, proBNP 1300, chest x-ray no acute disease process.  CT left lower extremity shows subcutaneous edema and swelling inside left lower leg, no evidence of abscess deep fascial plane edema or soft tissue air.  Significant events: Admitted 10/15/2024 left leg cellulitis and AKI 10-17-2024 left LE U/S negative for DVT 10-18-2024 nephrology consulted for worsening AKI, cards consulted for SOB/elevated BNP 10-19-2024 nephrology planning on renal biopsy on 10-20-2024      Assessment & Plan:   Principal Problem:   Cellulitis of left lower extremity Active Problems:   Acute kidney injury superimposed on chronic kidney disease   Elevated brain natriuretic peptide (BNP) level   Paroxysmal atrial fibrillation (HCC)   Morbid obesity with BMI of 50.0-59.9, adult (HCC)   Essential hypertension   CKD (chronic kidney disease), stage II - Baseline scr around 1.3   Obstructive sleep apnea   Typical atrial flutter (HCC)   1-Cellulitis Left lower extremity: Patient failed outpatient Augmentin, he stated that he got scratched by his dog nails. -Doppler negative for DVT - Continue  Zyvox , transition ceftriaxone  to cefadroxil /  will tx for 10 days.  -Improving.   AKI superimposed on CKD stage II baseline 1.3: In the setting of IgA glomerulonephritis Patient presented with a creatinine of 2.0. Differential diagnosis ATN, versus  glomerular disease. He developed shortness of breath received IV Lasix  noted to have elevated BNP. Nephrology consulted, plan is to proceed with renal biopsy Underwent renal biopsy 2/04, awaiting results.  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  Hyperkalemia; repeat Lokelma  dose  Elevated BNP: Echo normal left ventricular ejection fraction no diastolic dysfunction cardiology consulted no further workup recommended.  They have signed off.   Obstructive sleep apnea: Continue CPAP No significant hypoxia during nocturnal pulse oxymetry   Hypertension: Continue Lopressor   Morbid obesity BMI 50 Need lifestyle modification  Paroxysmal A-fib Continue Lopressor  and flecainide           Estimated body mass index is 53.72 kg/m as calculated from the following:   Height as of this encounter: 5' 6 (1.676 m).   Weight as of this encounter: 151 kg.   DVT prophylaxis: SCDs Code Status: Full code Family Communication: Discussed with patient Disposition Plan:  Status is: Inpatient Remains inpatient appropriate because: Management of AKI and cellulitis    Consultants:  Nephrology  Procedures:  Renal biopsy  Antimicrobials:    Subjective: Redness improved.  Report skin is very dry   Objective: Vitals:   10/22/24 0133 10/22/24 0256 10/22/24 0419 10/22/24 0741  BP: 128/75 (!) 145/77 136/79 (!) 150/92  Pulse: (!) 58 (!) 58 (!) 57 60  Resp:      Temp: (!) 97.5 F (36.4 C) 97.9 F (36.6 C) 97.6 F (36.4 C) 97.6 F (36.4 C)  TempSrc:      SpO2: 97% 97% 96% 98%  Weight:  Height:        Intake/Output Summary (Last 24 hours) at 10/22/2024 0750 Last data filed at 10/21/2024 1813 Gross per 24 hour  Intake 1019 ml  Output 1010 ml  Net 9 ml   Filed Weights   10/15/24 1819 10/20/24 0451 10/21/24  0500  Weight: (!) 147 kg (!) 151 kg (!) 151 kg    Examination:  General exam: NAD Respiratory system: CTA Cardiovascular system: S 1, S 2 RRR Gastrointestinal system: BS present, soft, nt Extremities: left LE with improved redness   Data Reviewed: I have personally reviewed following labs and imaging studies  CBC: Recent Labs  Lab 10/15/24 1830 10/17/24 0319 10/20/24 0339  WBC 8.7 9.2 11.3*  HGB 13.7 12.4* 12.9*  HCT 41.5 37.7* 38.8*  MCV 86.3 85.1 84.7  PLT 258 238 282   Basic Metabolic Panel: Recent Labs  Lab 10/18/24 0341 10/19/24 0427 10/20/24 0339 10/21/24 0125 10/22/24 0406  NA 138 134* 136 137 141  K 4.3 4.5 4.5 5.2* 5.4*  CL 104 101 103 103 108  CO2 21* 19* 20* 23 19*  GLUCOSE 88 100* 93 97 169*  BUN 60* 69* 74* 78* 78*  CREATININE 3.13* 4.36* 4.41* 4.57* 4.32*  CALCIUM 7.9* 8.0* 8.2* 8.1* 8.1*  MG 2.0  --   --   --   --   PHOS  --  5.3* 5.6* 5.5* 4.0   GFR: Estimated Creatinine Clearance: 28.2 mL/min (A) (by C-G formula based on SCr of 4.32 mg/dL (H)). Liver Function Tests: Recent Labs  Lab 10/18/24 0341 10/19/24 0427 10/20/24 0339 10/21/24 0125 10/22/24 0406  AST 21  --   --   --   --   ALT 19  --   --   --   --   ALKPHOS 60  --   --   --   --   BILITOT 0.2  --   --   --   --   PROT 6.5  --   --   --   --   ALBUMIN 2.9* 2.9* 3.0* 3.1* 3.1*   No results for input(s): LIPASE, AMYLASE in the last 168 hours. No results for input(s): AMMONIA in the last 168 hours. Coagulation Profile: Recent Labs  Lab 10/20/24 0339  INR 1.1   Cardiac Enzymes: No results for input(s): CKTOTAL, CKMB, CKMBINDEX, TROPONINI in the last 168 hours. BNP (last 3 results) Recent Labs    10/15/24 1830  PROBNP 1,393.0*   HbA1C: No results for input(s): HGBA1C in the last 72 hours. CBG: No results for input(s): GLUCAP in the last 168 hours. Lipid Profile: No results for input(s): CHOL, HDL, LDLCALC, TRIG, CHOLHDL, LDLDIRECT in  the last 72 hours. Thyroid  Function Tests: No results for input(s): TSH, T4TOTAL, FREET4, T3FREE, THYROIDAB in the last 72 hours. Anemia Panel: No results for input(s): VITAMINB12, FOLATE, FERRITIN, TIBC, IRON, RETICCTPCT in the last 72 hours. Sepsis Labs: Recent Labs  Lab 10/15/24 1836  LATICACIDVEN 0.8    Recent Results (from the past 240 hours)  Culture, blood (Routine X 2) w Reflex to ID Panel     Status: None   Collection Time: 10/16/24  5:39 AM   Specimen: BLOOD  Result Value Ref Range Status   Specimen Description BLOOD SITE NOT SPECIFIED  Final   Special Requests   Final    BOTTLES DRAWN AEROBIC AND ANAEROBIC Blood Culture results may not be optimal due to an inadequate volume of blood received in culture bottles  Culture   Final    NO GROWTH 5 DAYS Performed at [facility], [address] [place], [place] [address]    Report Status 10/21/2024 FINAL  Final  Culture, blood (Routine X 2) w Reflex to ID Panel     Status: None   Collection Time: 10/16/24  5:39 AM   Specimen: BLOOD  Result Value Ref Range Status   Specimen Description BLOOD SITE NOT SPECIFIED  Final   Special Requests   Final    BOTTLES DRAWN AEROBIC AND ANAEROBIC Blood Culture results may not be optimal due to an inadequate volume of blood received in culture bottles   Culture   Final    NO GROWTH 5 DAYS Performed at [facility], [address] [place], [place] [address]    Report Status 10/21/2024 FINAL  Final         Radiology Studies: US  BIOPSY (KIDNEY) Result Date: 10/20/2024 INDICATION: 8 year old with acute kidney injury.  Request for a renal biopsy. EXAM: ULTRASOUND-GUIDED RANDOM RENAL BIOPSY MEDICATIONS: Moderate sedation ANESTHESIA/SEDATION: Moderate (conscious) sedation was employed during this procedure. A total of Versed  3 mg and Fentanyl  125 mcg was administered intravenously by the radiology nurse. Total intra-service moderate Sedation Time: 32  minutes. The patient's level of consciousness and vital signs were monitored continuously by radiology nursing throughout the procedure under my direct supervision. FLUOROSCOPY TIME:  None COMPLICATIONS: None immediate. PROCEDURE: Informed written consent was obtained from the patient after a thorough discussion of the procedural risks, benefits and alternatives. All questions were addressed. A timeout was performed prior to the initiation of the procedure. Patient was placed prone. Both kidneys were evaluated with ultrasound. Right kidney was selected for biopsy. Right flank was prepped and draped in sterile fashion. Maximal barrier sterile technique was utilized including caps, mask, sterile gowns, sterile gloves, sterile drape, hand hygiene and skin antiseptic. Skin was anesthetized using 1% lidocaine . Small incision was made. Using ultrasound guidance, a 17 gauge coaxial needle was directed to the lower pole cortex. Three core biopsies were obtained from the lower pole cortex with an 18 gauge core device. Specimens placed in saline. Gel-Foam slurry was injected as the 17 gauge coaxial needle was retracted. Bandage placed over the puncture site. FINDINGS: The first 2 core biopsies may represent perinephric fat. Third core biopsy looked adequate. No immediate bleeding or hematoma formation. IMPRESSION: Successful ultrasound-guided right renal biopsy. Electronically Signed   By: Juliene Balder M.D.   On: 10/20/2024 10:54        Scheduled Meds:  enoxaparin  (LOVENOX ) injection  40 mg Subcutaneous Q24H   famotidine   20 mg Oral Daily   flecainide   150 mg Oral BID   linezolid   600 mg Oral Q12H   metoprolol  tartrate  25 mg Oral BID   sodium chloride  flush  3 mL Intravenous Q12H   sodium chloride  flush  3 mL Intravenous Q12H   sodium zirconium cyclosilicate   10 g Oral Once   Continuous Infusions:  cefTRIAXone  (ROCEPHIN )  IV 2 g (10/22/24 0044)   methylPREDNISolone  (SOLU-MEDROL ) injection 500 mg (10/21/24  1813)     LOS: 6 days    Time spent: 35 Minutes    Layaan Mott A Neya Creegan, MD [facility]   If 7PM-7AM, please contact night-coverage [email or web address]  10/22/2024, 7:50 AM   "

## 2024-10-22 NOTE — Plan of Care (Signed)

## 2024-11-29 ENCOUNTER — Ambulatory Visit (HOSPITAL_COMMUNITY): Admitting: Physician Assistant
